# Patient Record
Sex: Female | Born: 1990 | Race: White | Hispanic: No | Marital: Single | Smoking: Never smoker
Health system: Southern US, Community
[De-identification: ages and names within clinical notes are randomized; demographics above are authoritative.]

## PROBLEM LIST (undated history)

## (undated) DIAGNOSIS — F319 Bipolar disorder, unspecified: Secondary | ICD-10-CM

## (undated) HISTORY — PX: KNEE ARTHROSCOPY W/ MENISCAL REPAIR: SHX1877

## (undated) HISTORY — DX: Bipolar disorder, unspecified: F31.9

---

## 2017-07-05 DIAGNOSIS — Z8619 Personal history of other infectious and parasitic diseases: Secondary | ICD-10-CM | POA: Insufficient documentation

## 2019-01-31 DIAGNOSIS — F3170 Bipolar disorder, currently in remission, most recent episode unspecified: Secondary | ICD-10-CM | POA: Insufficient documentation

## 2020-11-22 DIAGNOSIS — L700 Acne vulgaris: Secondary | ICD-10-CM | POA: Diagnosis not present

## 2020-11-22 DIAGNOSIS — Z3009 Encounter for other general counseling and advice on contraception: Secondary | ICD-10-CM | POA: Diagnosis not present

## 2020-12-22 DIAGNOSIS — Z20822 Contact with and (suspected) exposure to covid-19: Secondary | ICD-10-CM | POA: Diagnosis not present

## 2021-02-22 ENCOUNTER — Encounter: Payer: Self-pay | Admitting: Family Medicine

## 2021-02-22 ENCOUNTER — Other Ambulatory Visit: Payer: Self-pay

## 2021-02-22 ENCOUNTER — Ambulatory Visit: Payer: Managed Care, Other (non HMO) | Admitting: Family Medicine

## 2021-02-22 VITALS — BP 130/81 | HR 89 | Temp 98.1°F | Ht 65.16 in | Wt 210.3 lb

## 2021-02-22 DIAGNOSIS — F3170 Bipolar disorder, currently in remission, most recent episode unspecified: Secondary | ICD-10-CM

## 2021-02-22 MED ORDER — LAMOTRIGINE 150 MG PO TABS: 150.0000 mg | ORAL_TABLET | Freq: Every day | ORAL | 1 refills | Status: DC

## 2021-02-22 NOTE — Patient Instructions (Signed)
Great to meet you today! I have entered a referral to psychiatry Continue lamictal at current dose.

## 2021-02-22 NOTE — Progress Notes (Signed)
Lisa [name] - 30 y.o. female MRN [id number]  Date of birth: 04-Nov-1991  Subjective Chief Complaint  Patient presents with  . Establish Care    HPI Lisa [name] is a 30 y.o. female here today for initial visit to establish care.  She recently moved to the area from [place].  She has a history of bipolar disorder.  Previous records reviewed as available through care everywhere.  Her bipolar disorder is currently treated with Lamictal.  She is doing very well with this and denies any significant side effects.  She does need a refill of this medication.  She is also requesting referral to establish with a psychiatrist here.  Medications previously managed by psychiatry in [place].  ROS:  A comprehensive ROS was completed and negative except as noted per HPI  No Known Allergies  History reviewed. No pertinent past medical history.  Past Surgical History:  Procedure Laterality Date  . KNEE ARTHROSCOPY W/ MENISCAL REPAIR      Social History   Socioeconomic History  . Marital status: Married    Spouse name: Not on file  . Number of children: Not on file  . Years of education: Not on file  . Highest education level: Not on file  Occupational History  . Occupation: Risk analyst  Tobacco Use  . Smoking status: Never Smoker  . Smokeless tobacco: Never Used  Vaping Use  . Vaping Use: Never used  Substance and Sexual Activity  . Alcohol use: Yes    Alcohol/week: 0.0 - 1.0 standard drinks  . Drug use: Never  . Sexual activity: Yes    Birth control/protection: Pill  Other Topics Concern  . Not on file  Social History Narrative  . Not on file   Social Determinants of Health   Financial Resource Strain: Not on file  Food Insecurity: Not on file  Transportation Needs: Not on file  Physical Activity: Not on file  Stress: Not on file  Social Connections: Not on file    Family History  Problem Relation Age of Onset  . Hypertension Mother     Health Maintenance  Topic  Date Due  . Hepatitis C Screening  Never done  . HIV Screening  Never done  . PAP SMEAR-Modifier  Never done  . INFLUENZA VACCINE  07/28/2021 (Originally 06/27/2020)  . COVID-19 Vaccine (1) 07/28/2021 (Originally 12/04/1995)  . TETANUS/TDAP  01/07/2022  . HPV VACCINES  Aged Out     ----------------------------------------------------------------------------------------------------------------------------------------------------------------------------------------------------------------- Physical Exam BP 130/81 (BP Location: Left Arm, Patient Position: Sitting, Cuff Size: Large)   Pulse 89   Temp 98.1 F (36.7 C)   Ht 5' 5.16" (1.655 m)   Wt 210 lb 4.8 oz (95.4 kg)   SpO2 98%   BMI 34.83 kg/m   Physical Exam Constitutional:      Appearance: Normal appearance.  HENT:     Head: Normocephalic and atraumatic.  Eyes:     General: No scleral icterus. Cardiovascular:     Rate and Rhythm: Normal rate and regular rhythm.  Pulmonary:     Effort: Pulmonary effort is normal.     Breath sounds: Normal breath sounds.  Musculoskeletal:     Cervical back: Neck supple.  Neurological:     General: No focal deficit present.     Mental Status: She is alert.  Psychiatric:        Mood and Affect: Mood normal.        Behavior: Behavior normal.     ------------------------------------------------------------------------------------------------------------------------------------------------------------------------------------------------------------------- Assessment and Plan  Bipolar affective disorder in remission [facility]) Her bipolar disorder remains well controlled with Lamictal.  I recommend that she continue at this time and refills were sent to her local pharmacy until she is able to establish with local psychiatrist.  I did place a referral for psychiatry today and informed her that someone would contact her to schedule appointment.   Meds ordered this encounter  Medications  .  lamoTRIgine (LAMICTAL) 150 MG tablet    Sig: Take 1 tablet (150 mg total) by mouth daily.    Dispense:  90 tablet    Refill:  1    No follow-ups on file.    This visit occurred during the SARS-CoV-2 public health emergency.  Safety protocols were in place, including screening questions prior to the visit, additional usage of staff PPE, and extensive cleaning of exam room while observing appropriate contact time as indicated for disinfecting solutions.

## 2021-02-22 NOTE — Assessment & Plan Note (Signed)
Her bipolar disorder remains well controlled with Lamictal.  I recommend that she continue at this time and refills were sent to her local pharmacy until she is able to establish with local psychiatrist.  I did place a referral for psychiatry today and informed her that someone would contact her to schedule appointment.

## 2021-04-28 ENCOUNTER — Ambulatory Visit (INDEPENDENT_AMBULATORY_CARE_PROVIDER_SITE_OTHER): Payer: 59 | Admitting: Psychiatry

## 2021-04-28 ENCOUNTER — Encounter (HOSPITAL_COMMUNITY): Payer: Self-pay | Admitting: Psychiatry

## 2021-04-28 VITALS — BP 124/78 | Ht 65.0 in | Wt 189.0 lb

## 2021-04-28 DIAGNOSIS — F5102 Adjustment insomnia: Secondary | ICD-10-CM

## 2021-04-28 DIAGNOSIS — F3170 Bipolar disorder, currently in remission, most recent episode unspecified: Secondary | ICD-10-CM

## 2021-04-28 MED ORDER — TRAZODONE HCL 50 MG PO TABS: 50.0000 mg | ORAL_TABLET | Freq: Every day | ORAL | 0 refills | Status: DC

## 2021-04-28 NOTE — Progress Notes (Signed)
Psychiatric Initial Adult Assessment   Patient Identification: Lisa [name] MRN:  [id number] Date of Evaluation:  04/28/2021 Referral Source: Dr. Selena Batten Chief Complaint:  establish care,  Visit Diagnosis:    ICD-10-CM   1. Bipolar affective disorder in remission (HCC)  F31.70   2. Adjustment insomnia  F51.02     History of Present Illness: Patient is a 30 years old currently single [place] descent female lives with her parents relocated from [place].  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 Selena Batten to establish care for possible bipolar  Patient has been getting her medication from psychiatrist in [place] now relocated.  She describes history of having ups and down down.  Getting depression last 4 days a week including hopelessness but not suicidal thoughts including despair decreased interest withdrawn and sadness.  Her high period  Would be lasting for a week or more including high energy talkativeness excessive buying but no associated clear irrelevant behavior.  She did endorse some paranoia during that  She endorses worries would get excessive worries like panic attacks when she is on the downside or even on the higher side but when she is on medication she does feel that anxiety is manageable  A trigger in the past has been break-up in the relationship after 5 years this was last year and she went to into her depression phase and then a manic state or possible manic state  Does not advise as of now paranoia or hallucinations does not endorse panic attack  She does feel fear of abandonment because of past relationship concerns and also when she was younger she was very closely attached to her parents and had difficult time going to school  She also takes trazodone but she has had some weight concerns and low self-esteem so she stopped trazodone she is working on weight control and going to the gym in general she believes she would do better on trazodone as  it helps her sleep and indirectly anxiety   Time but endorses it was more so when she was on the downside  Aggravating factor: relationship breakups in the past, self esteem Modifying factor: parents, co workers, sister, gym work out  Duration since young age  Past suicide attempt denies Past drug use or alcohol denies    Past Psychiatric History: depression, anxiet6y  Previous Psychotropic Medications: Yes   Substance Abuse History in the last 12 months:  No.  Consequences of Substance Abuse: NA  Past Medical History: History reviewed. No pertinent past medical history.  Past Surgical History:  Procedure Laterality Date  . KNEE ARTHROSCOPY W/ MENISCAL REPAIR      Family Psychiatric History: mom: depression  Family History:  Family History  Problem Relation Age of Onset  . Hypertension Mother     Social History:   Social History   Socioeconomic History  . Marital status: Married    Spouse name: Not on file  . Number of children: Not on file  . Years of education: Not on file  . Highest education level: Not on file  Occupational History  . Occupation: Risk analyst  Tobacco Use  . Smoking status: Never Smoker  . Smokeless tobacco: Never Used  Vaping Use  . Vaping Use: Never used  Substance and Sexual Activity  . Alcohol use: Yes    Alcohol/week: 0.0 - 1.0 standard drinks  . Drug use: Never  . Sexual activity: Yes    Birth control/protection: Pill  Other Topics Concern  .  Not on file  Social History Narrative  . Not on file   Social Determinants of Health   Financial Resource Strain: Not on file  Food Insecurity: Not on file  Transportation Needs: Not on file  Physical Activity: Not on file  Stress: Not on file  Social Connections: Not on file    Additional Social History: grew up with parents, migrated from [place] age 73. Difficult in school adjustment and fear of abondomement   Allergies:  No Known Allergies  Metabolic Disorder  Labs: No results found for: HGBA1C, MPG No results found for: PROLACTIN No results found for: CHOL, TRIG, HDL, CHOLHDL, VLDL, LDLCALC No results found for: TSH  Therapeutic Level Labs: No results found for: LITHIUM No results found for: CBMZ No results found for: VALPROATE  Current Medications: Current Outpatient Medications  Medication Sig Dispense Refill  . drospirenone-ethinyl estradiol (YAZ) 3-0.02 MG tablet Take 1 tablet by mouth daily.    Marland Kitchen lamoTRIgine (LAMICTAL) 150 MG tablet Take 1 tablet (150 mg total) by mouth daily. 90 tablet 1  . traZODone (DESYREL) 50 MG tablet Take 1 tablet (50 mg total) by mouth at bedtime. 30 tablet 0   No current facility-administered medications for this visit.     Psychiatric Specialty Exam: Review of Systems  Cardiovascular: Negative for chest pain.  Psychiatric/Behavioral: Negative for agitation, dysphoric mood and self-injury.    Blood pressure 124/78, height 5\' 5"  (1.651 m), weight 189 lb (85.7 kg).Body mass index is 31.45 kg/m.  General Appearance: Casual  Eye Contact:  Fair  Speech:  Clear and Coherent  Volume:  Normal  Mood:  Euthymic  Affect:  Congruent  Thought Process:  Goal Directed  Orientation:  Full (Time, Place, and Person)  Thought Content:  Logical  Suicidal Thoughts:  No  Homicidal Thoughts:  No  Memory:  Immediate;   Fair Recent;   Good  Judgement:  Good  Insight:  Fair  Psychomotor Activity:  Normal  Concentration:  Concentration: Fair and Attention Span: Fair  Recall:  of Knowledge:Good  Language: Good  Akathisia:  No  Handed:    AIMS (if indicated):  not done  Assets:  Communication Skills Desire for Improvement Financial Resources/Insurance Housing  ADL's:  Intact  Cognition: WNL  Sleep:  Fair   Screenings: GAD-7   Flowsheet Row Office Visit from 02/22/2021 in [facility]  Total GAD-7 Score 4    PHQ2-9   Flowsheet Row Office Visit from 04/28/2021 in  [facility] [place] Office Visit from 02/22/2021 in [facility]  PHQ-2 Total Score 6 2  PHQ-9 Total Score 12 3    Flowsheet Row Office Visit from 04/28/2021 in [facility] [place]  C-SSRS RISK CATEGORY No Risk      Assessment and Plan: as follows  Bipolar per history: last episode depressed; doing fair on lamictal, will continue . No rash  insomnaL  Reviewed sleep hygiene, can restart trazadone small dose   Work on triggers to depression including abondonment fear or poor sleep, refer to therapy to work on 06/28/2021, self esteem and relationship concerns in the past  Fu 4-6 weeks or earlier if needed Total face to face time spent including chart review and documentation 45 min.   Pharmacologist, MD 6/2/202211:34 AM

## 2021-05-27 ENCOUNTER — Other Ambulatory Visit (HOSPITAL_COMMUNITY): Payer: Self-pay | Admitting: Psychiatry

## 2021-06-02 ENCOUNTER — Ambulatory Visit (INDEPENDENT_AMBULATORY_CARE_PROVIDER_SITE_OTHER): Payer: 59 | Admitting: Psychiatry

## 2021-06-02 ENCOUNTER — Encounter (HOSPITAL_COMMUNITY): Payer: Self-pay | Admitting: Psychiatry

## 2021-06-02 VITALS — BP 138/78 | Ht 65.0 in | Wt 183.0 lb

## 2021-06-02 DIAGNOSIS — F5102 Adjustment insomnia: Secondary | ICD-10-CM | POA: Diagnosis not present

## 2021-06-02 DIAGNOSIS — F3170 Bipolar disorder, currently in remission, most recent episode unspecified: Secondary | ICD-10-CM | POA: Diagnosis not present

## 2021-06-02 MED ORDER — TRAZODONE HCL 50 MG PO TABS: ORAL_TABLET | ORAL | 0 refills | Status: DC

## 2021-06-02 NOTE — Progress Notes (Signed)
[facility] Follow up visit  Patient Identification: Lisa [name] MRN:  [id number] Date of Evaluation:  06/02/2021 Referral Source: Dr. Selena Batten Chief Complaint:  follow up bipolar, sleep Visit Diagnosis:    ICD-10-CM   1. Bipolar affective disorder in remission (HCC)  F31.70     2. Adjustment insomnia  F51.02       History of Present Illness: Patient is a 30 years old currently single [place] descent female lives with her parents relocated from [place].  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 Selena Batten to establish care for possible bipolar   Doing fair on lamictal, has had concerns with relationship breakups in the past  Handling it fair . Supportive parents and sister Goes for walk and self growth is helping  Aggravating factor: relationship breakups in the past, self esteem Modifying factor: parents, co workers, sister, gym work out  Duration since young age  Past suicide attempt denies Past drug use or alcohol denies    Past Psychiatric History: depression, anxiet6y  Previous Psychotropic Medications: Yes   Substance Abuse History in the last 12 months:  No.  Consequences of Substance Abuse: NA  Past Medical History: No past medical history on file.  Past Surgical History:  Procedure Laterality Date   KNEE ARTHROSCOPY W/ MENISCAL REPAIR      Family Psychiatric History: mom: depression  Family History:  Family History  Problem Relation Age of Onset   Hypertension Mother     Social History:   Social History   Socioeconomic History   Marital status: Married    Spouse name: Not on file   Number of children: Not on file   Years of education: Not on file   Highest education level: Not on file  Occupational History   Occupation: Risk analyst  Tobacco Use   Smoking status: Never   Smokeless tobacco: Never  Vaping Use   Vaping Use: Never used  Substance and Sexual Activity   Alcohol use: Yes    Alcohol/week: 0.0 - 1.0  standard drinks   Drug use: Never   Sexual activity: Yes    Birth control/protection: Pill  Other Topics Concern   Not on file  Social History Narrative   Not on file   Social Determinants of Health   Financial Resource Strain: Not on file  Food Insecurity: Not on file  Transportation Needs: Not on file  Physical Activity: Not on file  Stress: Not on file  Social Connections: Not on file   w up with parents, migrated from [place] age 40. Difficult in school adjustment and fear of abondomement   Allergies:  No Known Allergies  Metabolic Disorder Labs: No results found for: HGBA1C, MPG No results found for: PROLACTIN No results found for: CHOL, TRIG, HDL, CHOLHDL, VLDL, LDLCALC No results found for: TSH  Therapeutic Level Labs: No results found for: LITHIUM No results found for: CBMZ No results found for: VALPROATE  Current Medications: Current Outpatient Medications  Medication Sig Dispense Refill   drospirenone-ethinyl estradiol (YAZ) 3-0.02 MG tablet Take 1 tablet by mouth daily.     lamoTRIgine (LAMICTAL) 150 MG tablet Take 1 tablet (150 mg total) by mouth daily. 90 tablet 1   traZODone (DESYREL) 50 MG tablet TAKE 1 TABLET BY MOUTH EVERYDAY AT BEDTIME 30 tablet 0   No current facility-administered medications for this visit.     Psychiatric Specialty Exam: Review of Systems  Cardiovascular:  Negative for chest pain.  Psychiatric/Behavioral:  Negative for  agitation, dysphoric mood and self-injury.    Blood pressure 138/78, height 5\' 5"  (1.651 m), weight 183 lb (83 kg).Body mass index is 30.45 kg/m.  General Appearance: Casual  Eye Contact:  Fair  Speech:  Clear and Coherent  Volume:  Normal  Mood:  Euthymic  Affect:  Congruent  Thought Process:  Goal Directed  Orientation:  Full (Time, Place, and Person)  Thought Content:  Logical  Suicidal Thoughts:  No  Homicidal Thoughts:  No  Memory:  Immediate;   Fair Recent;   Good  Judgement:  Good  Insight:   Fair  Psychomotor Activity:  Normal  Concentration:  Concentration: Fair and Attention Span: Fair  Recall:  of Knowledge:Good  Language: Good  Akathisia:  No  Handed:    AIMS (if indicated):  not done  Assets:  Communication Skills Desire for Improvement Financial Resources/Insurance Housing  ADL's:  Intact  Cognition: WNL  Sleep:  Fair   Screenings: GAD-7    Flowsheet Row Office Visit from 02/22/2021 in [facility]  Total GAD-7 Score 4      PHQ2-9    Flowsheet Row Office Visit from 04/28/2021 in [facility] [place] Office Visit from 02/22/2021 in [facility]  PHQ-2 Total Score 6 2  PHQ-9 Total Score 12 3      Flowsheet Row Office Visit from 06/02/2021 in [facility] [place] Office Visit from 04/28/2021 in [facility] [place]  C-SSRS RISK CATEGORY No Risk No Risk       Assessment and Plan: as follows  Prior documentation reviewed  Bipolar per history: last episode depressed; fair on lamictal, improved. No rash  Insomna: doing fair on trazadone Scheduled therapy with 06/28/2021 to work on relationship breakups and coping  Fu 66m.  Total face to face time spent including chart review and documentation 1m   , MD 7/7/20229:12 AM

## 2021-06-08 ENCOUNTER — Ambulatory Visit (INDEPENDENT_AMBULATORY_CARE_PROVIDER_SITE_OTHER): Payer: 59 | Admitting: Licensed Clinical Social Worker

## 2021-06-08 DIAGNOSIS — F3170 Bipolar disorder, currently in remission, most recent episode unspecified: Secondary | ICD-10-CM

## 2021-06-08 DIAGNOSIS — F5102 Adjustment insomnia: Secondary | ICD-10-CM
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
## 2021-06-21 ENCOUNTER — Other Ambulatory Visit (HOSPITAL_COMMUNITY): Payer: Self-pay | Admitting: Psychiatry

## 2021-07-14 ENCOUNTER — Ambulatory Visit (HOSPITAL_COMMUNITY): Payer: 59 | Admitting: Licensed Clinical Social Worker

## 2021-07-14 NOTE — Progress Notes (Signed)
Therapist contacted by text for session and she did not respond. Session is a no show

## 2021-07-23 ENCOUNTER — Other Ambulatory Visit (HOSPITAL_COMMUNITY): Payer: Self-pay | Admitting: Psychiatry

## 2021-08-04 ENCOUNTER — Ambulatory Visit (INDEPENDENT_AMBULATORY_CARE_PROVIDER_SITE_OTHER): Payer: 59 | Admitting: Licensed Clinical Social Worker

## 2021-08-04 DIAGNOSIS — F5102 Adjustment insomnia: Secondary | ICD-10-CM

## 2021-08-04 DIAGNOSIS — F3170 Bipolar disorder, currently in remission, most recent episode unspecified: Secondary | ICD-10-CM
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
## 2021-08-18 ENCOUNTER — Ambulatory Visit (HOSPITAL_COMMUNITY): Payer: 59 | Admitting: Licensed Clinical Social Worker

## 2021-08-19 ENCOUNTER — Other Ambulatory Visit: Payer: Self-pay | Admitting: Family Medicine

## 2021-08-22 ENCOUNTER — Other Ambulatory Visit (HOSPITAL_COMMUNITY): Payer: Self-pay | Admitting: Psychiatry

## 2021-09-06 ENCOUNTER — Ambulatory Visit (INDEPENDENT_AMBULATORY_CARE_PROVIDER_SITE_OTHER): Payer: 59 | Admitting: Psychiatry

## 2021-09-06 ENCOUNTER — Encounter (HOSPITAL_COMMUNITY): Payer: Self-pay | Admitting: Psychiatry

## 2021-09-06 VITALS — BP 120/76 | Temp 97.6°F | Ht 65.0 in | Wt 166.0 lb

## 2021-09-06 DIAGNOSIS — F5102 Adjustment insomnia: Secondary | ICD-10-CM | POA: Diagnosis not present

## 2021-09-06 DIAGNOSIS — F3170 Bipolar disorder, currently in remission, most recent episode unspecified: Secondary | ICD-10-CM | POA: Diagnosis not present

## 2021-09-06 MED ORDER — CITALOPRAM HYDROBROMIDE 10 MG PO TABS: 10.0000 mg | ORAL_TABLET | Freq: Every day | ORAL | 0 refills | Status: DC

## 2021-09-06 MED ORDER — LAMOTRIGINE 150 MG PO TABS: 150.0000 mg | ORAL_TABLET | Freq: Every day | ORAL | 0 refills | Status: DC

## 2021-09-06 NOTE — Progress Notes (Signed)
[facility] Follow up visit  Patient Identification: Lisa [name] MRN:  [id number] Date of Evaluation:  09/06/2021 Referral Source: Dr. Selena Batten Chief Complaint:  follow up bipolar, sleep Visit Diagnosis:    ICD-10-CM   1. Bipolar affective disorder in remission (HCC)  F31.70     2. Adjustment insomnia  F51.02       History of Present Illness: Patient is a 30 years old currently single [place] descent female lives with her parents relocated from [place].  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 Selena Batten to establish care for possible bipolar  Was doing fair, but now feeling down at times hopeless, no particular trigger and feels amotivated Has had breakups in past Sister is supportive and family Goes for walk and gym to help herself Has not seen counsellor recently, will reschedule  Aggravating factor: relationship breakups in the past, self esteem Modifying factor: parents, co workers, sister, gym work out  Duration since young age  Past suicide attempt denies Past drug use or alcohol denies    Past Psychiatric History: depression, [id number]  Previous Psychotropic Medications: Yes   Substance Abuse History in the last 12 months:  No.  Consequences of Substance Abuse: NA  Past Medical History: History reviewed. No pertinent past medical history.  Past Surgical History:  Procedure Laterality Date   KNEE ARTHROSCOPY W/ MENISCAL REPAIR      Family Psychiatric History: mom: depression  Family History:  Family History  Problem Relation Age of Onset   Hypertension Mother     Social History:   Social History   Socioeconomic History   Marital status: Single    Spouse name: Not on file   Number of children: Not on file   Years of education: Not on file   Highest education level: Not on file  Occupational History   Occupation: Risk analyst  Tobacco Use   Smoking status: Never   Smokeless tobacco: Never  Vaping Use   Vaping  Use: Never used  Substance and Sexual Activity   Alcohol use: Yes    Alcohol/week: 0.0 - 1.0 standard drinks   Drug use: Never   Sexual activity: Yes    Birth control/protection: Pill  Other Topics Concern   Not on file  Social History Narrative   Not on file   Social Determinants of Health   Financial Resource Strain: Not on file  Food Insecurity: Not on file  Transportation Needs: Not on file  Physical Activity: Not on file  Stress: Not on file  Social Connections: Not on file   w up with parents, migrated from [place] age 67. Difficult in school adjustment and fear of abondomement   Allergies:  No Known Allergies  Metabolic Disorder Labs: No results found for: HGBA1C, MPG No results found for: PROLACTIN No results found for: CHOL, TRIG, HDL, CHOLHDL, VLDL, LDLCALC No results found for: TSH  Therapeutic Level Labs: No results found for: LITHIUM No results found for: CBMZ No results found for: VALPROATE  Current Medications: Current Outpatient Medications  Medication Sig Dispense Refill   citalopram (CELEXA) 10 MG tablet Take 1 tablet (10 mg total) by mouth daily. 30 tablet 0   drospirenone-ethinyl estradiol (YAZ) 3-0.02 MG tablet Take 1 tablet by mouth daily.     lamoTRIgine (LAMICTAL) 150 MG tablet Take 1 tablet (150 mg total) by mouth daily. 30 tablet 0   traZODone (DESYREL) 50 MG tablet TAKE 1 TABLET BY MOUTH EVERYDAY AT BEDTIME 30 tablet 0  No current facility-administered medications for this visit.     Psychiatric Specialty Exam: Review of Systems  Cardiovascular:  Negative for chest pain.  Psychiatric/Behavioral:  Positive for dysphoric mood. Negative for agitation and self-injury.    Blood pressure 120/76, temperature 97.6 F (36.4 C), height 5\' 5"  (1.651 m), weight 166 lb (75.3 kg).Body mass index is 27.62 kg/m.  General Appearance: Casual  Eye Contact:  Fair  Speech:  Clear and Coherent  Volume:  Normal  Mood:  dysphoric  Affect:  Congruent   Thought Process:  Goal Directed  Orientation:  Full (Time, Place, and Person)  Thought Content:  Logical  Suicidal Thoughts:  No  Homicidal Thoughts:  No  Memory:  Immediate;   Fair Recent;   Good  Judgement:  Good  Insight:  Fair  Psychomotor Activity:  Normal  Concentration:  Concentration: Fair and Attention Span: Fair  Recall:  of Knowledge:Good  Language: Good  Akathisia:  No  Handed:    AIMS (if indicated):  not done  Assets:  Communication Skills Desire for Improvement Financial Resources/Insurance Housing  ADL's:  Intact  Cognition: WNL  Sleep:  Fair   Screenings: GAD-7    Flowsheet Row Office Visit from 02/22/2021 in [facility]  Total GAD-7 Score 4      PHQ2-9    Flowsheet Row Counselor from 06/08/2021 in [facility] [place] Office Visit from 04/28/2021 in [facility] [place] Office Visit from 02/22/2021 in [facility]  PHQ-2 Total Score 1 6 2   PHQ-9 Total Score -- 12 3      Flowsheet Row Office Visit from 09/06/2021 in [facility] [place] Counselor from 06/08/2021 in [facility] [place] Office Visit from 06/02/2021 in [facility] [place] [facility]  C-SSRS RISK CATEGORY No Risk No Risk No Risk       Assessment and Plan: as follows  Prior documentation reviewed  Bipolar per history: last episode depressed;feeling dysphoric, continue lamictal. Add celexa 10mg  increase to 20mg  in one week or call earlier for conerns   Insomna: manageable on trazadone Schedule therapy again with 06/10/2021, has not seen her to work on depression and coping skills Time spent in office face to face 08/03/2021  , MD 10/11/20228:48 AM

## 2021-09-18 ENCOUNTER — Other Ambulatory Visit (HOSPITAL_COMMUNITY): Payer: Self-pay | Admitting: Psychiatry

## 2021-09-19 ENCOUNTER — Other Ambulatory Visit (HOSPITAL_COMMUNITY): Payer: Self-pay | Admitting: Psychiatry

## 2021-09-29 ENCOUNTER — Ambulatory Visit (INDEPENDENT_AMBULATORY_CARE_PROVIDER_SITE_OTHER): Payer: 59 | Admitting: Psychiatry

## 2021-09-29 ENCOUNTER — Encounter (HOSPITAL_COMMUNITY): Payer: Self-pay | Admitting: Psychiatry

## 2021-09-29 VITALS — BP 120/88 | HR 72 | Ht 66.0 in | Wt 163.0 lb

## 2021-09-29 DIAGNOSIS — F5102 Adjustment insomnia: Secondary | ICD-10-CM

## 2021-09-29 DIAGNOSIS — F3170 Bipolar disorder, currently in remission, most recent episode unspecified: Secondary | ICD-10-CM | POA: Diagnosis not present

## 2021-09-29 MED ORDER — CITALOPRAM HYDROBROMIDE 10 MG PO TABS: 10.0000 mg | ORAL_TABLET | Freq: Every day | ORAL | 1 refills | Status: DC

## 2021-09-29 NOTE — Progress Notes (Signed)
[facility] Follow up visit  Patient Identification: Lisa [name] MRN:  [id number] Date of Evaluation:  09/29/2021 Referral Source: Dr. Selena [name] Chief Complaint:  follow up bipolar, sleep Visit Diagnosis:    ICD-10-CM   1. Bipolar affective disorder in remission (HCC)  F31.70     2. Adjustment insomnia  F51.02       History of Present Illness: Patient is a 30 years old currently single [place] descent female lives with her parents relocated from [place].  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 Selena [name] to establish care for possible bipolar   Was feeling down depressed last visit with no particular triggers sister is supportive.  We added citalopram 10 mg that has helped she is feeling happy less depressed and feeling the medication has helped.  She is more motivated   Aggravating factor: relationship breakups in the past, self esteem Modifying factor: Parents, co workers, sister, gym work out  Duration since young age  Past suicide attempt denies Past drug use or alcohol denies    Past Psychiatric History: depression, anxiet6y  Previous Psychotropic Medications: Yes   Substance Abuse History in the last 12 months:  No.  Consequences of Substance Abuse: NA  Past Medical History: History reviewed. No pertinent past medical history.  Past Surgical History:  Procedure Laterality Date   KNEE ARTHROSCOPY W/ MENISCAL REPAIR      Family Psychiatric History: mom: depression  Family History:  Family History  Problem Relation Age of Onset   Hypertension Mother     Social History:   Social History   Socioeconomic History   Marital status: Single    Spouse name: Not on file   Number of children: Not on file   Years of education: Not on file   Highest education level: Not on file  Occupational History   Occupation: Risk analyst  Tobacco Use   Smoking status: Never   Smokeless tobacco: Never  Vaping Use   Vaping Use: Never used   Substance and Sexual Activity   Alcohol use: Yes    Alcohol/week: 0.0 - 1.0 standard drinks   Drug use: Never   Sexual activity: Yes    Birth control/protection: Pill  Other Topics Concern   Not on file  Social History Narrative   Not on file   Social Determinants of Health   Financial Resource Strain: Not on file  Food Insecurity: Not on file  Transportation Needs: Not on file  Physical Activity: Not on file  Stress: Not on file  Social Connections: Not on file   w up with parents, migrated from [place] age 29. Difficult in school adjustment and fear of abondomement   Allergies:  No Known Allergies  Metabolic Disorder Labs: No results found for: HGBA1C, MPG No results found for: PROLACTIN No results found for: CHOL, TRIG, HDL, CHOLHDL, VLDL, LDLCALC No results found for: TSH  Therapeutic Level Labs: No results found for: LITHIUM No results found for: CBMZ No results found for: VALPROATE  Current Medications: Current Outpatient Medications  Medication Sig Dispense Refill   drospirenone-ethinyl estradiol (YAZ) 3-0.02 MG tablet Take 1 tablet by mouth daily.     lamoTRIgine (LAMICTAL) 150 MG tablet Take 1 tablet (150 mg total) by mouth daily. 30 tablet 0   traZODone (DESYREL) 50 MG tablet TAKE 1 TABLET BY MOUTH EVERYDAY AT BEDTIME 90 tablet 0   citalopram (CELEXA) 10 MG tablet Take 1 tablet (10 mg total) by mouth daily. 30 tablet 1  No current facility-administered medications for this visit.     Psychiatric Specialty Exam: Review of Systems  Cardiovascular:  Negative for chest pain.  Psychiatric/Behavioral:  Negative for agitation and self-injury.    Blood pressure 120/88, [name] 72, height 5\' 6"  (1.676 m), weight 163 lb (73.9 kg), SpO2 99 %.Body mass index is 26.31 kg/m.  General Appearance: Casual  Eye Contact:  Fair  Speech:  Clear and Coherent  Volume:  Normal  Mood: Improved   Affect:  Congruent  Thought Process:  Goal Directed  Orientation:  Full  (Time, Place, and Person)  Thought Content:  Logical  Suicidal Thoughts:  No  Homicidal Thoughts:  No  Memory:  Immediate;   Fair Recent;   Good  Judgement:  Good  Insight:  Fair  Psychomotor Activity:  Normal  Concentration:  Concentration: Fair and Attention Span: Fair  Recall:  of Knowledge:Good  Language: Good  Akathisia:  No  Handed:    AIMS (if indicated):  not done  Assets:  Communication Skills Desire for Improvement Financial Resources/Insurance Housing  ADL's:  Intact  Cognition: WNL  Sleep:  Fair   Screenings: GAD-7    Flowsheet Row Office Visit from 02/22/2021 in [facility]  Total GAD-7 Score 4      PHQ2-9    Flowsheet Row Office Visit from 09/29/2021 in [facility] [place] Counselor from 06/08/2021 in [facility] AT [place] Office Visit from 04/28/2021 in [facility] AT [place] Office Visit from 02/22/2021 in [facility]  PHQ-2 Total Score 0 1 6 2   PHQ-9 Total Score -- -- 12 3      Flowsheet Row Office Visit from 09/29/2021 in [facility] AT [place] Office Visit from 09/06/2021 in [facility] AT [place] Counselor from 06/08/2021 in [facility] AT [place]  C-SSRS RISK CATEGORY No Risk No Risk No Risk       Assessment and Plan: as follows  Prior documentation reviewed  Bipolar per history: last episode depressed; improved less dysphoric continue Lamictal citalopram has helped we will continue 10 mg  Insomna: Manageable on trazodone discussed sleep hygiene Time spent in office face to face 10 to 15 minutes Follow-up in 3 months renewed meds which were due 11/06/2021, MD 11/3/20228:51 AM

## 2021-09-30 ENCOUNTER — Ambulatory Visit (INDEPENDENT_AMBULATORY_CARE_PROVIDER_SITE_OTHER): Payer: 59 | Admitting: Licensed Clinical Social Worker

## 2021-09-30 DIAGNOSIS — F3170 Bipolar disorder, currently in remission, most recent episode unspecified: Secondary | ICD-10-CM | POA: Diagnosis not present

## 2021-09-30 DIAGNOSIS — F5102 Adjustment insomnia: Secondary | ICD-10-CM

## 2021-09-30 NOTE — Progress Notes (Signed)
  Virtual Visit via Video Note  I connected with Lisa [name] on 09/30/21 at  9:00 AM EDT by a video enabled telemedicine application and verified that I am speaking with the correct person using two identifiers.  Location: Patient: home Provider: home office   I discussed the limitations of evaluation and management by telemedicine and the availability of in person appointments. The patient expressed understanding and agreed to proceed.   I discussed the assessment and treatment plan with the patient. The patient was provided an opportunity to ask questions and all were answered. The patient agreed with the plan and demonstrated an understanding of the instructions.   The patient was advised to call back or seek an in-person evaluation if the symptoms worsen or if the condition fails to improve as anticipated.  I provided 16 minutes of non-face-to-face time during this encounter.  THERAPIST PROGRESS NOTE  Session Time: 9:00 AM to 9:16 AM  Participation Level: Active  Behavioral Response: CasualAlertEuthymic  Type of Therapy: Individual Therapy  Treatment Goals addressed:  Work on imaging triggers that bring back memories of past relationship, anxiety, self-esteem, coping Interventions: Solution Focused, Strength-based, Supportive, and Other: coping   Summary: Lisa [name] is a 30 y.o. female who presents with having episodes of thoughts of dying. Didn't now what happened but got really depressed. Met with doctor and medicine he prescribed working well.  Reviewed what she wants to do with session if she wants to look at worksheets and she prefers that therapist since her depression workbook and therapist will also send her worksheet on depression separately.  We will review at next session.  Reviewed what we had been working on being triggered to think about past relationship.  She found trick for triggers  that works well. She stopped listening to music associated with the relationship and  listening to all new music. She thinks music can help tremendously. Listening to EDM which she describes as electronic dance music.  Therapist provided active listening open questions supportive interventions  Suicidal/Homicidal: No  Plan: Return again in 5 weeks.2.  Review chapters of depression work book sent to patient, as well as worksheet review any other issues patient brings up at session  Diagnosis: Axis I: bipolar disorder in remission, adjustment insomnia    Axis II: No diagnosis    Cordella Register, LCSW 09/30/2021

## 2021-10-13 ENCOUNTER — Other Ambulatory Visit (HOSPITAL_COMMUNITY): Payer: Self-pay | Admitting: Psychiatry

## 2021-10-31 ENCOUNTER — Other Ambulatory Visit (HOSPITAL_COMMUNITY): Payer: Self-pay | Admitting: Psychiatry

## 2021-11-04 ENCOUNTER — Ambulatory Visit (HOSPITAL_COMMUNITY): Payer: 59 | Admitting: Licensed Clinical Social Worker

## 2021-12-29 ENCOUNTER — Encounter (HOSPITAL_COMMUNITY): Payer: Self-pay | Admitting: Psychiatry

## 2021-12-29 ENCOUNTER — Ambulatory Visit (INDEPENDENT_AMBULATORY_CARE_PROVIDER_SITE_OTHER): Payer: 59 | Admitting: Psychiatry

## 2021-12-29 VITALS — BP 108/78 | Temp 97.7°F | Ht 65.0 in | Wt 155.0 lb

## 2021-12-29 DIAGNOSIS — F3170 Bipolar disorder, currently in remission, most recent episode unspecified: Secondary | ICD-10-CM | POA: Diagnosis not present

## 2021-12-29 DIAGNOSIS — F5102 Adjustment insomnia: Secondary | ICD-10-CM

## 2021-12-29 MED ORDER — LAMOTRIGINE 150 MG PO TABS: 150.0000 mg | ORAL_TABLET | Freq: Every day | ORAL | 0 refills | Status: DC

## 2021-12-29 MED ORDER — CITALOPRAM HYDROBROMIDE 10 MG PO TABS: 10.0000 mg | ORAL_TABLET | Freq: Every day | ORAL | 0 refills | Status: DC

## 2021-12-29 NOTE — Progress Notes (Signed)
[facility] Follow up visit  Patient Identification: Lisa [name] MRN:  [id number] Date of Evaluation:  12/29/2021 Referral Source: Dr. Selena Batten Chief Complaint:  follow up bipolar, sleep Visit Diagnosis:    ICD-10-CM   1. Bipolar affective disorder in remission (HCC)  F31.70     2. Adjustment insomnia  F51.02       History of Present Illness: Patient is a 31 years old currently single [place] descent female lives with her parents relocated from [place].  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 Selena Batten to establish care for possible bipolar  Doing fair, going to gym has helped, less anxious  Severity improved Celexa additon has helped  Aggravating factor: relationship breakups in the past, self esteem Modifying factor: parents, co workers, sister, gym work out  Duration since young age  Past suicide attempt denies Past drug use or alcohol denies    Past Psychiatric History: depression, anxiet6y  Previous Psychotropic Medications: Yes   Substance Abuse History in the last 12 months:  No.  Consequences of Substance Abuse: NA  Past Medical History: No past medical history on file.  Past Surgical History:  Procedure Laterality Date   KNEE ARTHROSCOPY W/ MENISCAL REPAIR      Family Psychiatric History: mom: depression  Family History:  Family History  Problem Relation Age of Onset   Hypertension Mother     Social History:   Social History   Socioeconomic History   Marital status: Single    Spouse name: Not on file   Number of children: Not on file   Years of education: Not on file   Highest education level: Not on file  Occupational History   Occupation: Risk analyst  Tobacco Use   Smoking status: Never   Smokeless tobacco: Never  Vaping Use   Vaping Use: Never used  Substance and Sexual Activity   Alcohol use: Yes    Alcohol/week: 0.0 - 1.0 standard drinks   Drug use: Never   Sexual activity: Yes    Birth  control/protection: Pill  Other Topics Concern   Not on file  Social History Narrative   Not on file   Social Determinants of Health   Financial Resource Strain: Not on file  Food Insecurity: Not on file  Transportation Needs: Not on file  Physical Activity: Not on file  Stress: Not on file  Social Connections: Not on file   w up with parents, migrated from [place] age 24. Difficult in school adjustment and fear of abondomement   Allergies:  No Known Allergies  Metabolic Disorder Labs: No results found for: HGBA1C, MPG No results found for: PROLACTIN No results found for: CHOL, TRIG, HDL, CHOLHDL, VLDL, LDLCALC No results found for: TSH  Therapeutic Level Labs: No results found for: LITHIUM No results found for: CBMZ No results found for: VALPROATE  Current Medications: Current Outpatient Medications  Medication Sig Dispense Refill   citalopram (CELEXA) 10 MG tablet Take 1 tablet (10 mg total) by mouth daily. 90 tablet 0   drospirenone-ethinyl estradiol (YAZ) 3-0.02 MG tablet Take 1 tablet by mouth daily.     lamoTRIgine (LAMICTAL) 150 MG tablet Take 1 tablet (150 mg total) by mouth daily. 90 tablet 0   traZODone (DESYREL) 50 MG tablet TAKE 1 TABLET BY MOUTH EVERYDAY AT BEDTIME 90 tablet 0   No current facility-administered medications for this visit.     Psychiatric Specialty Exam: Review of Systems  Cardiovascular:  Negative for chest pain.  Neurological:  Negative for tremors.  Psychiatric/Behavioral:  Negative for agitation and self-injury.    Blood pressure 108/78, temperature 97.7 F (36.5 C), height 5\' 5"  (1.651 m), weight 155 lb (70.3 kg).Body mass index is 25.79 kg/m.  General Appearance: Casual  Eye Contact:  Fair  Speech:  Clear and Coherent  Volume:  Normal  Mood: Improved   Affect:  Congruent  Thought Process:  Goal Directed  Orientation:  Full (Time, Place, and Person)  Thought Content:  Logical  Suicidal Thoughts:  No  Homicidal Thoughts:  No   Memory:  Immediate;   Fair Recent;   Good  Judgement:  Good  Insight:  Fair  Psychomotor Activity:  Normal  Concentration:  Concentration: Fair and Attention Span: Fair  Recall:  of Knowledge:Good  Language: Good  Akathisia:  No  Handed:    AIMS (if indicated):  not done  Assets:  Communication Skills Desire for Improvement Financial Resources/Insurance Housing  ADL's:  Intact  Cognition: WNL  Sleep:  Fair   Screenings: GAD-7    Flowsheet Row Office Visit from 02/22/2021 in [facility]  Total GAD-7 Score 4      PHQ2-9    Flowsheet Row Office Visit from 09/29/2021 in [facility] [place] Counselor from 06/08/2021 in [facility] AT [place] Office Visit from 04/28/2021 in [facility] AT [place] Office Visit from 02/22/2021 in [facility]  PHQ-2 Total Score 0 1 6 2   PHQ-9 Total Score -- -- 12 3      Flowsheet Row Office Visit from 12/29/2021 in [facility] AT [place] Office Visit from 09/29/2021 in [facility] AT [place] Office Visit from 09/06/2021 in [facility] AT [place]  C-SSRS RISK CATEGORY No Risk No Risk No Risk       Assessment and Plan: as follows Prior documentation reviewed  No rash   Bipolar per history: last episode depressed; diong fair continue lamictal, celexa  Insomna: manageable with trazadone, will continue  Fu 51m.  Time spent in office face to face 11/06/2021 1m, MD 2/2/20238:41 AM

## 2022-01-01 ENCOUNTER — Other Ambulatory Visit (HOSPITAL_COMMUNITY): Payer: Self-pay | Admitting: Psychiatry

## 2022-02-03 ENCOUNTER — Ambulatory Visit: Payer: Medicaid Other | Admitting: Medical-Surgical

## 2022-03-15 ENCOUNTER — Ambulatory Visit (INDEPENDENT_AMBULATORY_CARE_PROVIDER_SITE_OTHER): Payer: Managed Care, Other (non HMO) | Admitting: Family Medicine

## 2022-03-15 ENCOUNTER — Encounter: Payer: Self-pay | Admitting: Family Medicine

## 2022-03-15 ENCOUNTER — Ambulatory Visit (INDEPENDENT_AMBULATORY_CARE_PROVIDER_SITE_OTHER): Payer: Managed Care, Other (non HMO)

## 2022-03-15 VITALS — BP 110/73 | HR 74 | Ht 65.0 in | Wt 161.0 lb

## 2022-03-15 DIAGNOSIS — M7989 Other specified soft tissue disorders: Secondary | ICD-10-CM | POA: Insufficient documentation

## 2022-03-15 NOTE — Progress Notes (Signed)
?  Lisa [name] - 31 y.o. female MRN [id number]  Date of birth: [birth year] ? ?Subjective ?No chief complaint on file. ? ? ?HPI ?Lisa [name] is a 31 y.o. female here today with complaint of leg swelling and tightness. She has had persistent leg swelling with tightness in the calf for the past couple of months.  The intensity of the swelling worsens depending on how much she is on her feet.  She denies severe pain or shortness of breath.  She does not recall any injury or overuse.  She is a non-smoker and does not use any hormonal contraception.   ? ?ROS:  A comprehensive ROS was completed and negative except as noted per HPI ? ?No Known Allergies ? ?No past medical history on file. ? ?Past Surgical History:  ?Procedure Laterality Date  ? KNEE ARTHROSCOPY W/ MENISCAL REPAIR    ? ? ?Social History  ? ?Socioeconomic History  ? Marital status: Single  ?  Spouse name: Not on file  ? Number of children: Not on file  ? Years of education: Not on file  ? Highest education level: Not on file  ?Occupational History  ? Occupation: Risk analyst  ?Tobacco Use  ? Smoking status: Never  ? Smokeless tobacco: Never  ?Vaping Use  ? Vaping Use: Never used  ?Substance and Sexual Activity  ? Alcohol use: Yes  ?  Alcohol/week: 0.0 - 1.0 standard drinks  ? Drug use: Never  ? Sexual activity: Yes  ?  Birth control/protection: Pill  ?Other Topics Concern  ? Not on file  ?Social History Narrative  ? Not on file  ? ?Social Determinants of Health  ? ?Financial Resource Strain: Not on file  ?Food Insecurity: Not on file  ?Transportation Needs: Not on file  ?Physical Activity: Not on file  ?Stress: Not on file  ?Social Connections: Not on file  ? ? ?Family History  ?Problem Relation Age of Onset  ? Hypertension Mother   ? ? ?Health Maintenance  ?Topic Date Due  ? COVID-19 Vaccine (1) Never done  ? HIV Screening  Never done  ? Hepatitis C Screening  Never done  ? PAP SMEAR-Modifier  Never done  ? TETANUS/TDAP  01/07/2022  ? INFLUENZA VACCINE   06/27/2022  ? HPV VACCINES  Aged Out  ? ? ? ?----------------------------------------------------------------------------------------------------------------------------------------------------------------------------------------------------------------- ?Physical Exam ?BP 110/73 (BP Location: Left Arm, Patient Position: Sitting, Cuff Size: Normal)   Pulse 74   Ht 5\' 5"  (1.651 m)   Wt 161 lb (73 kg)   SpO2 99%   BMI 26.79 kg/m?  ? ?Physical Exam ?Constitutional:   ?   Appearance: Normal appearance.  ?Musculoskeletal:  ?   Comments: Left leg larger when compared to R.  She has feeling of tightness with compression of calf.  Increased tightness but no pain with [place].   ?Neurological:  ?   Mental Status: She is alert.  ? ? ?------------------------------------------------------------------------------------------------------------------------------------------------------------------------------------------------------------------- ?Assessment and Plan ? ?Left leg swelling ?I have concern about possible DVT causing her leg swelling.  Orders entered for stat of the lower extremity.   ? ? ?No orders of the defined types were placed in this encounter. ? ? ?No follow-ups on file. ? ? ? ?This visit occurred during the SARS-CoV-2 public health emergency.  Safety protocols were in place, including screening questions prior to the visit, additional usage of staff PPE, and extensive cleaning of exam room while observing appropriate contact time as indicated for disinfecting solutions.  ? ?

## 2022-03-15 NOTE — Assessment & Plan Note (Signed)
I have concern about possible DVT causing her leg swelling.  Orders entered for stat US of the lower extremity.   ?

## 2022-03-27 ENCOUNTER — Other Ambulatory Visit (HOSPITAL_COMMUNITY): Payer: Self-pay | Admitting: Psychiatry

## 2022-04-11 ENCOUNTER — Ambulatory Visit (HOSPITAL_COMMUNITY): Payer: 59 | Admitting: Psychiatry

## 2022-04-18 ENCOUNTER — Other Ambulatory Visit (HOSPITAL_COMMUNITY): Payer: Self-pay | Admitting: Psychiatry

## 2022-04-22 ENCOUNTER — Other Ambulatory Visit (HOSPITAL_COMMUNITY): Payer: Self-pay | Admitting: Psychiatry

## 2022-05-02 ENCOUNTER — Telehealth (HOSPITAL_COMMUNITY): Payer: Self-pay

## 2022-05-02 ENCOUNTER — Other Ambulatory Visit (HOSPITAL_COMMUNITY): Payer: Self-pay | Admitting: Psychiatry

## 2022-05-02 NOTE — Telephone Encounter (Signed)
Dr. Gilmore Laroche sent a 30 day Rx on 04-18-22 with note that pt needs appt within 1 month, so she is not yet out but does not have appt on his book so I will not refill.

## 2022-05-02 NOTE — Telephone Encounter (Signed)
Medication management - Telephone call with pt after speaking with pt's [facility] to arrange for a one time 30 day order of pt's Lamictal to be filled from order e-scribed in on 04/18/22 by Dr. Gilmore Laroche and arranged for pt to be seen on 05/29/22 at 3:00 pm.  Patient agreed to appointment time that day and will request 90 day order of medications to be sent in after that evaluation, per her insurance coverage approvals and requests.

## 2022-05-02 NOTE — Telephone Encounter (Signed)
New message    1. Which medications need to be refilled? (please list name of each medication and dose if known) lamoTRIgine (LAMICTAL) 150 MG tablet  2. Which pharmacy/location (including street and city if local pharmacy) is medication to be sent to?[id number] - [place],  - [address]  3. Do they need a 30 day or 90 day supply? 30 days supply

## 2022-05-29 ENCOUNTER — Ambulatory Visit (HOSPITAL_COMMUNITY): Payer: 59 | Admitting: Psychiatry

## 2022-05-31 ENCOUNTER — Telehealth (HOSPITAL_COMMUNITY): Payer: Self-pay

## 2022-05-31 NOTE — Telephone Encounter (Signed)
Medication refill - Fax from pt's [facility] on [address] for a new Lamotrigine order, last prescribed 04/18/22 with note for pt to make an appointment.  Pt had one for 05/29/22 that was reschedueld for provier out that day.  Returns now on 06/19/22.  New refill order needed until that time.

## 2022-06-02 ENCOUNTER — Other Ambulatory Visit (HOSPITAL_COMMUNITY): Payer: Self-pay

## 2022-06-02 MED ORDER — LAMOTRIGINE 150 MG PO TABS: 150.0000 mg | ORAL_TABLET | Freq: Every day | ORAL | 0 refills | Status: DC

## 2022-06-19 ENCOUNTER — Telehealth (HOSPITAL_COMMUNITY): Payer: 59 | Admitting: Psychiatry

## 2022-06-19 ENCOUNTER — Encounter (HOSPITAL_COMMUNITY): Payer: Self-pay | Admitting: Psychiatry

## 2022-06-19 DIAGNOSIS — F3170 Bipolar disorder, currently in remission, most recent episode unspecified: Secondary | ICD-10-CM | POA: Diagnosis not present

## 2022-06-19 DIAGNOSIS — F5102 Adjustment insomnia: Secondary | ICD-10-CM

## 2022-06-19 MED ORDER — TRAZODONE HCL 50 MG PO TABS: ORAL_TABLET | ORAL | 0 refills | Status: DC

## 2022-06-19 MED ORDER — CITALOPRAM HYDROBROMIDE 10 MG PO TABS: 10.0000 mg | ORAL_TABLET | Freq: Every day | ORAL | 0 refills | Status: DC

## 2022-06-19 MED ORDER — LAMOTRIGINE 150 MG PO TABS: 150.0000 mg | ORAL_TABLET | Freq: Every day | ORAL | 0 refills | Status: DC

## 2022-06-19 NOTE — Progress Notes (Signed)
[facility] Follow up visit  Patient Identification: Shameca Landen MRN:  [id number] Date of Evaluation:  06/19/2022 Referral Source: Dr. Selena Batten Chief Complaint:  follow up bipolar, sleep Visit Diagnosis:    ICD-10-CM   1. Bipolar affective disorder in remission (HCC)  F31.70     2. Adjustment insomnia  F51.02     Virtual Visit via Video Note  I connected with [name] on 06/19/22 at  1:00 PM EDT by a video enabled telemedicine application and verified that I am speaking with the correct person using two identifiers.  Location: Patient: work Provider: home office   I discussed the limitations of evaluation and management by telemedicine and the availability of in person appointments. The patient expressed understanding and agreed to proceed.      I discussed the assessment and treatment plan with the patient. The patient was provided an opportunity to ask questions and all were answered. The patient agreed with the plan and demonstrated an understanding of the instructions.   The patient was advised to call back or seek an in-person evaluation if the symptoms worsen or if the condition fails to improve as anticipated.  I provided 15 minutes of non-face-to-face time during this encounter.    History of Present Illness: Patient is a 31 years old currently single [place] descent female lives with her parents relocated from [place].  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 Selena Batten to establish care for possible bipolar  Doing fair, job stess is manageable,  Goes to gym and fels mood ,depression is balanced   Aggravating factor: relationship breakups in the past, self esteem Modifying factor: parents, co workers, sister, gym work out  Duration since young age  Past suicide attempt denies Past drug use or alcohol denies    Past Psychiatric History: depression, anxiet6y  Previous Psychotropic Medications: Yes   Substance Abuse History in  the last 12 months:  No.  Consequences of Substance Abuse: NA  Past Medical History: History reviewed. No pertinent past medical history.  Past Surgical History:  Procedure Laterality Date   KNEE ARTHROSCOPY W/ MENISCAL REPAIR      Family Psychiatric History: mom: depression  Family History:  Family History  Problem Relation Age of Onset   Hypertension Mother     Social History:   Social History   Socioeconomic History   Marital status: Single    Spouse name: Not on file   Number of children: Not on file   Years of education: Not on file   Highest education level: Not on file  Occupational History   Occupation: Risk analyst  Tobacco Use   Smoking status: Never   Smokeless tobacco: Never  Vaping Use   Vaping Use: Never used  Substance and Sexual Activity   Alcohol use: Yes    Alcohol/week: 0.0 - 1.0 standard drinks of alcohol   Drug use: Never   Sexual activity: Yes    Birth control/protection: Pill  Other Topics Concern   Not on file  Social History Narrative   Not on file   Social Determinants of Health   Financial Resource Strain: Not on file  Food Insecurity: Not on file  Transportation Needs: Not on file  Physical Activity: Not on file  Stress: Not on file  Social Connections: Not on file   w up with parents, migrated from [place] age 55. Difficult in school adjustment and fear of abondomement   Allergies:  No Known Allergies  Metabolic Disorder Labs:  No results found for: "HGBA1C", "MPG" No results found for: "PROLACTIN" No results found for: "CHOL", "TRIG", "HDL", "CHOLHDL", "VLDL", "LDLCALC" No results found for: "TSH"  Therapeutic Level Labs: No results found for: "LITHIUM" No results found for: "CBMZ" No results found for: "VALPROATE"  Current Medications: Current Outpatient Medications  Medication Sig Dispense Refill   citalopram (CELEXA) 10 MG tablet Take 1 tablet (10 mg total) by mouth daily. 90 tablet 0   lamoTRIgine (LAMICTAL)  150 MG tablet Take 1 tablet (150 mg total) by mouth daily. 90 tablet 0   traZODone (DESYREL) 50 MG tablet Take at night 90 tablet 0   No current facility-administered medications for this visit.     Psychiatric Specialty Exam: Review of Systems  Cardiovascular:  Negative for chest pain.  Neurological:  Negative for tremors.  Psychiatric/Behavioral:  Negative for agitation and self-injury.     There were no vitals taken for this visit.There is no height or weight on file to calculate BMI.  General Appearance: Casual  Eye Contact:  Fair  Speech:  Clear and Coherent  Volume:  Normal  Mood: Improved   Affect:  Congruent  Thought Process:  Goal Directed  Orientation:  Full (Time, Place, and Person)  Thought Content:  Logical  Suicidal Thoughts:  No  Homicidal Thoughts:  No  Memory:  Immediate;   Fair Recent;   Good  Judgement:  Good  Insight:  Fair  Psychomotor Activity:  Normal  Concentration:  Concentration: Fair and Attention Span: Fair  Recall:  [employer] of Knowledge:Good  Language: Good  Akathisia:  No  Handed:    AIMS (if indicated):  not done  Assets:  Communication Skills Desire for Improvement Financial Resources/Insurance Housing  ADL's:  Intact  Cognition: WNL  Sleep:  Fair   Screenings: GAD-7    Flowsheet Row Office Visit from [facility]/22/2021 in [facility]  Total GAD-7 Score 4      PHQ2-9    Flowsheet Row Office Visit from 09/29/2021 in [facility] AT [place] Counselor from 06/08/2021 in [facility] AT [place] Office Visit from 04/28/2021 in [facility] AT [place] Office Visit from 02/22/2021 in [facility] At [facility]  PHQ-2 Total Score 0 1 6 2   PHQ-9 Total Score -- -- 12 3      Flowsheet Row Video Visit from 06/19/2022 in [facility] AT [place] Office Visit from 12/29/2021 in  [facility] AT [place] Office Visit from 09/29/2021 in [facility] AT [place]  C-SSRS RISK CATEGORY No Risk No Risk No Risk       Assessment and Plan: as follows  Prior documentation reviewed  Bipolar per history: last episode depressed; doing stable, continue celexa, lamictal  Insomna:manageable with trazadone, will send refills  No rash or side effect Fu 2m.  11m, MD 7/24/20231:13 PM

## 2022-06-30 ENCOUNTER — Encounter: Payer: Self-pay | Admitting: Family Medicine

## 2022-08-06 ENCOUNTER — Other Ambulatory Visit (HOSPITAL_COMMUNITY): Payer: Self-pay | Admitting: Psychiatry

## 2022-10-16 ENCOUNTER — Telehealth (INDEPENDENT_AMBULATORY_CARE_PROVIDER_SITE_OTHER): Payer: BLUE CROSS/BLUE SHIELD | Admitting: Psychiatry

## 2022-10-16 ENCOUNTER — Encounter (HOSPITAL_COMMUNITY): Payer: Self-pay | Admitting: Psychiatry

## 2022-10-16 DIAGNOSIS — F5102 Adjustment insomnia: Secondary | ICD-10-CM

## 2022-10-16 DIAGNOSIS — F3170 Bipolar disorder, currently in remission, most recent episode unspecified: Secondary | ICD-10-CM

## 2022-10-16 MED ORDER — CITALOPRAM HYDROBROMIDE 10 MG PO TABS: 10.0000 mg | ORAL_TABLET | Freq: Every day | ORAL | 0 refills | Status: DC

## 2022-10-16 MED ORDER — TRAZODONE HCL 50 MG PO TABS: ORAL_TABLET | ORAL | 0 refills | Status: DC

## 2022-10-16 MED ORDER — LAMOTRIGINE 150 MG PO TABS: 150.0000 mg | ORAL_TABLET | Freq: Every day | ORAL | 0 refills | Status: DC

## 2022-10-16 NOTE — Progress Notes (Signed)
[facility] Follow up visit  Patient Identification: Lisa [name] MRN:  [id number] Date of Evaluation:  10/16/2022 Referral Source: Dr. Selena Batten Chief Complaint:  follow up bipolar, sleep Visit Diagnosis:    ICD-10-CM   1. Bipolar affective disorder in remission (HCC)  F31.70     2. Adjustment insomnia  F51.02     Virtual Visit via Video Note  I connected with [name] on 10/16/22 at  1:00 PM EST by a video enabled telemedicine application and verified that I am speaking with the correct person using two identifiers.  Location: Patient: work Provider: home office   I discussed the limitations of evaluation and management by telemedicine and the availability of in person appointments. The patient expressed understanding and agreed to proceed.      I discussed the assessment and treatment plan with the patient. The patient was provided an opportunity to ask questions and all were answered. The patient agreed with the plan and demonstrated an understanding of the instructions.   The patient was advised to call back or seek an in-person evaluation if the symptoms worsen or if the condition fails to improve as anticipated.  I provided 15 minutes of non-face-to-face time during this encounter.    History of Present Illness: Patient is a 31 years old currently single [place] descent female lives with her parents relocated from [place].  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 Selena Batten to establish care for possible bipolar   Doing better, changed job now working at [employer] as Risk analyst. Likes the job  Aggravating factor: relationship breakups in the past, self esteem Modifying factor: parents co workers, sister, gym work out  Duration since young age  Past suicide attempt denies Past drug use or alcohol denies    Past Psychiatric History: depression, anxiet6y  Previous Psychotropic Medications: Yes   Substance Abuse History in the last  12 months:  No.  Consequences of Substance Abuse: NA  Past Medical History: History reviewed. No pertinent past medical history.  Past Surgical History:  Procedure Laterality Date   KNEE ARTHROSCOPY W/ MENISCAL REPAIR      Family Psychiatric History: mom: depression  Family History:  Family History  Problem Relation Age of Onset   Hypertension Mother     Social History:   Social History   Socioeconomic History   Marital status: Single    Spouse name: Not on file   Number of children: Not on file   Years of education: Not on file   Highest education level: Not on file  Occupational History   Occupation: Risk analyst  Tobacco Use   Smoking status: Never   Smokeless tobacco: Never  Vaping Use   Vaping Use: Never used  Substance and Sexual Activity   Alcohol use: Yes    Alcohol/week: 0.0 - 1.0 standard drinks of alcohol   Drug use: Never   Sexual activity: Yes    Birth control/protection: Pill  Other Topics Concern   Not on file  Social History Narrative   Not on file   Social Determinants of Health   Financial Resource Strain: Not on file  Food Insecurity: Not on file  Transportation Needs: Not on file  Physical Activity: Not on file  Stress: Not on file  Social Connections: Not on file   w up with parents, migrated from [place] age 40. Difficult in school adjustment and fear of abondomement   Allergies:  No Known Allergies  Metabolic Disorder Labs: No results  found for: "HGBA1C", "MPG" No results found for: "PROLACTIN" No results found for: "CHOL", "TRIG", "HDL", "CHOLHDL", "VLDL", "LDLCALC" No results found for: "TSH"  Therapeutic Level Labs: No results found for: "LITHIUM" No results found for: "CBMZ" No results found for: "VALPROATE"  Current Medications: Current Outpatient Medications  Medication Sig Dispense Refill   citalopram (CELEXA) 10 MG tablet Take 1 tablet (10 mg total) by mouth daily. 90 tablet 0   lamoTRIgine (LAMICTAL) 150 MG  tablet Take 1 tablet (150 mg total) by mouth daily. 90 tablet 0   traZODone (DESYREL) 50 MG tablet TAKE 1 TABLET EVERY EVENING 90 tablet 0   No current facility-administered medications for this visit.     Psychiatric Specialty Exam: Review of Systems  Cardiovascular:  Negative for chest pain.  Neurological:  Negative for tremors.  Psychiatric/Behavioral:  Negative for agitation and self-injury.     There were no vitals taken for this visit.There is no height or weight on file to calculate BMI.  General Appearance: Casual  Eye Contact:  Fair  Speech:  Clear and Coherent  Volume:  Normal  Mood: Improved   Affect:  Congruent  Thought Process:  Goal Directed  Orientation:  Full (Time, Place, and Person)  Thought Content:  Logical  Suicidal Thoughts:  No  Homicidal Thoughts:  No  Memory:  Immediate;   Fair Recent;   Good  Judgement:  Good  Insight:  Fair  Psychomotor Activity:  Normal  Concentration:  Concentration: Fair and Attention Span: Fair  Recall:  [employer] of Knowledge:Good  Language: Good  Akathisia:  No  Handed:    AIMS (if indicated):  not done  Assets:  Communication Skills Desire for Improvement Financial Resources/Insurance Housing  ADL's:  Intact  Cognition: WNL  Sleep:  Fair   Screenings: GAD-7    Flowsheet Row Office Visit from 02/22/2021 in [facility]  Total GAD-7 Score 4      PHQ2-9    Flowsheet Row Office Visit from 09/29/2021 in [facility] AT [place] Counselor from 06/08/2021 in [facility] AT [place] Office Visit from 04/28/2021 in [facility] AT [place] Office Visit from 02/22/2021 in [facility] At [facility]  PHQ-2 Total Score 0 1 6 2   PHQ-9 Total Score -- -- 12 3      Flowsheet Row Video Visit from 10/16/2022 in [facility] AT [employer] Video Visit from 06/19/2022 in  [facility] CENTER AT [place] Office Visit from 12/29/2021 in [facility] AT [place]  C-SSRS RISK CATEGORY No Risk No Risk No Risk       Assessment and Plan: as follows Prior documentation reviewed  Bipolar per history: last episode depressed; stable continue lamictal and celexa She discussed celexa taper when ready, considering doing stable will re visit idea in spring next year.   Insomna:fair on trazadone 06-18-1991, MD 11/20/20231:09 PM

## 2023-02-12 ENCOUNTER — Telehealth (INDEPENDENT_AMBULATORY_CARE_PROVIDER_SITE_OTHER): Payer: BLUE CROSS/BLUE SHIELD | Admitting: Psychiatry

## 2023-02-12 ENCOUNTER — Encounter (HOSPITAL_COMMUNITY): Payer: Self-pay | Admitting: Psychiatry

## 2023-02-12 DIAGNOSIS — F3170 Bipolar disorder, currently in remission, most recent episode unspecified: Secondary | ICD-10-CM | POA: Diagnosis not present

## 2023-02-12 DIAGNOSIS — F5102 Adjustment insomnia: Secondary | ICD-10-CM

## 2023-02-12 MED ORDER — LAMOTRIGINE 150 MG PO TABS: 150.0000 mg | ORAL_TABLET | Freq: Every day | ORAL | 0 refills | Status: DC

## 2023-02-12 NOTE — Progress Notes (Signed)
[name] Follow up visit  Patient Identification: Lisa [name] MRN:  [id number] Date of Evaluation:  02/12/2023 Referral Source: Dr. Einar Pheasant Chief Complaint:  follow up bipolar, sleep Visit Diagnosis:    ICD-10-CM   1. Bipolar affective disorder in remission ([place])  F31.70     2. Adjustment insomnia  F51.02      Virtual Visit via Video Note  I connected with Lisa [name] on 02/12/23 at  1:00 PM EDT by a video enabled telemedicine application and verified that I am speaking with the correct person using two identifiers.  Location: Patient: work Provider: home office   I discussed the limitations of evaluation and management by telemedicine and the availability of in person appointments. The patient expressed understanding and agreed to proceed.        I discussed the assessment and treatment plan with the patient. The patient was provided an opportunity to ask questions and all were answered. The patient agreed with the plan and demonstrated an understanding of the instructions.   The patient was advised to call back or seek an in-person evaluation if the symptoms worsen or if the condition fails to improve as anticipated.  I provided 15 minutes of non-face-to-face time during this encounter.    History of Present Illness: Patient is a 32 years old currently single [place] [name] descent female lives with her parents relocated from [place].  She is working full-time as a Corporate treasurer initially referred initially by Dr. Einar Pheasant to establish care for possible bipolar   Doing better, has been asking to taper off celexa,  She has good support system, also has changed trazadone to prn   Aggravating factor: relationship breakups in the past, self esteem Modifying factor: parents co workers, sister, gym work out  Duration since young age  Past suicide attempt denies Past drug use or alcohol denies    Past Psychiatric History: depression, anxiet6y  Previous Psychotropic Medications: Yes    Substance Abuse History in the last 12 months:  No.  Consequences of Substance Abuse: NA  Past Medical History: No past medical history on file.  Past Surgical History:  Procedure Laterality Date   KNEE ARTHROSCOPY W/ MENISCAL REPAIR      Family Psychiatric History: mom: depression  Family History:  Family History  Problem Relation Age of Onset   Hypertension Mother     Social History:   Social History   Socioeconomic History   Marital status: Single    Spouse name: Not on file   Number of children: Not on file   Years of education: Not on file   Highest education level: Not on file  Occupational History   Occupation: Corporate treasurer  Tobacco Use   Smoking status: Never   Smokeless tobacco: Never  Vaping Use   Vaping Use: Never used  Substance and Sexual Activity   Alcohol use: Yes    Alcohol/week: 0.0 - 1.0 standard drinks of alcohol   Drug use: Never   Sexual activity: Yes    Birth control/protection: Pill  Other Topics Concern   Not on file  Social History Narrative   Not on file   Social Determinants of Health   Financial Resource Strain: Not on file  Food Insecurity: Not on file  Transportation Needs: Not on file  Physical Activity: Not on file  Stress: Not on file  Social Connections: Not on file   w up with parents, migrated from [place] age 62. Difficult in school adjustment and fear of abondomement   Allergies:  No Known Allergies  Metabolic Disorder Labs: No results found for: "HGBA1C", "MPG" No results found for: "PROLACTIN" No results found for: "CHOL", "TRIG", "HDL", "CHOLHDL", "VLDL", "LDLCALC" No results found for: "TSH"  Therapeutic Level Labs: No results found for: "LITHIUM" No results found for: "CBMZ" No results found for: "VALPROATE"  Current Medications: Current Outpatient Medications  Medication Sig Dispense Refill   lamoTRIgine (LAMICTAL) 150 MG tablet Take 1 tablet (150 mg total) by mouth daily. 90 tablet 0    traZODone (DESYREL) 50 MG tablet TAKE 1 TABLET EVERY EVENING 90 tablet 0   No current facility-administered medications for this visit.     Psychiatric Specialty Exam: Review of Systems  Cardiovascular:  Negative for chest pain.  Neurological:  Negative for tremors.  Psychiatric/Behavioral:  Negative for agitation and self-injury.     There were no vitals taken for this visit.There is no height or weight on file to calculate BMI.  General Appearance: Casual  Eye Contact:  Fair  Speech:  Clear and Coherent  Volume:  Normal  Mood: Improved   Affect:  Congruent  Thought Process:  Goal Directed  Orientation:  Full (Time, Place, and Person)  Thought Content:  Logical  Suicidal Thoughts:  No  Homicidal Thoughts:  No  Memory:  Immediate;   Fair Recent;   Good  Judgement:  Good  Insight:  Fair  Psychomotor Activity:  Normal  Concentration:  Concentration: Fair and Attention Span: Fair  Recall:  [employer] of Knowledge:Good  Language: Good  Akathisia:  No  Handed:    AIMS (if indicated):  not done  Assets:  Communication Skills Desire for Improvement Financial Resources/Insurance Housing  ADL's:  Intact  Cognition: WNL  Sleep:  Fair   Screenings: GAD-7    [place] Office Visit from 02/22/2021 in [place] [facility]  Total GAD-7 Score 4      PHQ2-9    [place] Visit from 09/29/2021 in [place] at [place] from 06/08/2021 in [place] at [place] Visit from 04/28/2021 in [place] at [place] Visit from 02/22/2021 in [place] at [facility]  PHQ-2 Total Score 0 1 6 2   PHQ-9 Total Score -- -- 12 3      Flowsheet Row Video Visit from 10/16/2022 in [place] at [facility] Video Visit from 06/19/2022 in [facility] at [place] Visit from 12/29/2021 in [place] [facility] at [place] No Risk No Risk No Risk       Assessment and Plan: as follows  Prior documentation reviewed  Bipolar per history: last episode depressed; remains stable wants to taper off celexa, discussed risks, will lower to 5mg  for next 2 weeks and then stop Family need to be aware of if symptoms re occur to inform  Continue lamictal, no rash  Insomna:fair, trazadone is prn , not taking regularly  Fu 67m.  Merian Capron, MD 3/18/20241:07 PM

## 2023-05-11 ENCOUNTER — Telehealth (HOSPITAL_COMMUNITY): Payer: BLUE CROSS/BLUE SHIELD | Admitting: Psychiatry

## 2023-06-11 ENCOUNTER — Telehealth (HOSPITAL_COMMUNITY): Payer: BLUE CROSS/BLUE SHIELD | Admitting: Psychiatry

## 2023-06-11 ENCOUNTER — Encounter (HOSPITAL_COMMUNITY): Payer: Self-pay

## 2023-06-11 ENCOUNTER — Telehealth (HOSPITAL_COMMUNITY): Payer: Self-pay | Admitting: Psychiatry

## 2023-06-11 NOTE — Telephone Encounter (Signed)
Called to reschedule today's 1230 appointment due to conflict in provider's schedule. Left voicemail and sent text requesting call back to  reschedule.

## 2023-06-27 ENCOUNTER — Telehealth (INDEPENDENT_AMBULATORY_CARE_PROVIDER_SITE_OTHER): Payer: BLUE CROSS/BLUE SHIELD | Admitting: Psychiatry

## 2023-06-27 ENCOUNTER — Encounter (HOSPITAL_COMMUNITY): Payer: Self-pay | Admitting: Psychiatry

## 2023-06-27 DIAGNOSIS — F3170 Bipolar disorder, currently in remission, most recent episode unspecified: Secondary | ICD-10-CM

## 2023-06-27 DIAGNOSIS — F5102 Adjustment insomnia: Secondary | ICD-10-CM | POA: Diagnosis not present

## 2023-06-27 MED ORDER — LAMOTRIGINE 150 MG PO TABS: 150.0000 mg | ORAL_TABLET | Freq: Every day | ORAL | 0 refills | Status: DC

## 2023-06-27 NOTE — Progress Notes (Signed)
[facility] Follow up visit  Patient Identification: Lisa [name] MRN:  [id number] Date of Evaluation:  06/27/2023 Referral Source: Dr. Selena Batten Chief Complaint:  follow up bipolar,  Visit Diagnosis:    ICD-10-CM   1. Bipolar affective disorder in remission (HCC)  F31.70     2. Adjustment insomnia  F51.02     Virtual Visit via Video Note  I connected with Lisa [name] on 06/27/23 at 2:25 PM by a video enabled telemedicine application and verified that I am speaking with the correct person using two identifiers.  Location: Patient: work Provider: home office    I discussed the limitations of evaluation and management by telemedicine and the availability of in person appointments. The patient expressed understanding and agreed to proceed.   I discussed the assessment and treatment plan with the patient. The patient was provided an opportunity to ask questions and all were answered. The patient agreed with the plan and demonstrated an understanding of the instructions.   The patient was advised to call back or seek an in-person evaluation if the symptoms worsen or if the condition fails to improve as anticipated.  I provided 15 minutes of non-face-to-face time during this encounter.   [name], MD     History of Present Illness: Patient is a 32 years old currently single [place] descent female lives with her parents relocated from [place].  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 Selena Batten to establish care for possible bipolar   She is doing better, have tapered and now off celexa, depression manageable, some work stress but handling it. Is now in a relationship, some stress there but doing fair  Aggravating factor: relationship breakups in the past,  self esteem in past Modifying factor: parents co workers, sister, gym  Duration since young age  Severity improved    Past Psychiatric History: depression, anxiet6y  Previous Psychotropic  Medications: Yes   Substance Abuse History in the last 12 months:  No.  Consequences of Substance Abuse: NA  Past Medical History: History reviewed. No pertinent past medical history.  Past Surgical History:  Procedure Laterality Date   KNEE ARTHROSCOPY W/ MENISCAL REPAIR      Family Psychiatric History: mom: depression  Family History:  Family History  Problem Relation Age of Onset   Hypertension Mother     Social History:   Social History   Socioeconomic History   Marital status: Single    Spouse name: Not on file   Number of children: Not on file   Years of education: Not on file   Highest education level: Not on file  Occupational History   Occupation: Risk analyst  Tobacco Use   Smoking status: Never   Smokeless tobacco: Never  Vaping Use   Vaping status: Never Used  Substance and Sexual Activity   Alcohol use: Yes    Alcohol/week: 0.0 - 1.0 standard drinks of alcohol   Drug use: Never   Sexual activity: Yes    Birth control/protection: Pill  Other Topics Concern   Not on file  Social History Narrative   Not on file   Social Determinants of Health   Financial Resource Strain: Low Risk  (12/11/2022)   [facility], [facility]   Overall Financial Resource Strain (CARDIA)    Difficulty of Paying Living Expenses: Not very hard  Food Insecurity: No Food Insecurity (12/11/2022)   Received from [facility], [facility]   Hunger Vital Sign    Worried  About Running Out of Food in the Last Year: Never true    Ran Out of Food in the Last Year: Never true  Transportation Needs: No Transportation Needs (12/11/2022)   [facility]   [facility] - Transportation    Lack of Transportation (Medical): No    Lack of Transportation (Non-Medical): No  Physical Activity: Sufficiently Active (12/08/2022)   Received from [facility], [facility]   Exercise Vital Sign    Days of Exercise per Week: 3 days    Minutes of  Exercise per Session: 50 min  Stress: Stress Concern Present (12/08/2022)   Received from [facility], [facility] of Occupational Health - Occupational Stress Questionnaire    Feeling of Stress : To some extent  Social Connections: Moderately Integrated (12/08/2022)   [facility], [facility]   Social Network    How would you rate your social network (family, work, friends)?: Adequate participation with social networks   w up with parents, migrated from [place] age 15. Difficult in school adjustment and fear of abondomement   Allergies:  No Known Allergies  Metabolic Disorder Labs: No results found for: "HGBA1C", "MPG" No results found for: "PROLACTIN" No results found for: "CHOL", "TRIG", "HDL", "CHOLHDL", "VLDL", "LDLCALC" No results found for: "TSH"  Therapeutic Level Labs: No results found for: "LITHIUM" No results found for: "CBMZ" No results found for: "VALPROATE"  Current Medications: Current Outpatient Medications  Medication Sig Dispense Refill   lamoTRIgine (LAMICTAL) 150 MG tablet Take 1 tablet (150 mg total) by mouth daily. 90 tablet 0   No current facility-administered medications for this visit.     Psychiatric Specialty Exam: Review of Systems  Cardiovascular:  Negative for chest pain.  Neurological:  Negative for tremors.  Psychiatric/Behavioral:  Negative for agitation and self-injury.     There were no vitals taken for this visit.There is no height or weight on file to calculate BMI.  General Appearance: Casual  Eye Contact:  Fair  Speech:  Clear and Coherent  Volume:  Normal  Mood: Improved   Affect:  Congruent  Thought Process:  Goal Directed  Orientation:  Full (Time, Place, and Person)  Thought Content:  Logical  Suicidal Thoughts:  No  Homicidal Thoughts:  No  Memory:  Immediate;   Fair Recent;   Good  Judgement:  Good  Insight:  Fair  Psychomotor Activity:  Normal  Concentration:  Concentration:  Fair and Attention Span: Fair  Recall:  [employer] of Knowledge:Good  Language: Good  Akathisia:  No  Handed:    AIMS (if indicated):  not done  Assets:  Communication Skills Desire for Improvement Financial Resources/Insurance Housing  ADL's:  Intact  Cognition: WNL  Sleep:  Fair   Screenings: GAD-7    Flowsheet Row Office Visit from 02/22/2021 in [facility] Primary Care & Sports Medicine at [facility]  Total GAD-7 Score 4      PHQ2-9    Flowsheet Row Office Visit from 09/29/2021 in [facility] at [facility] Counselor from 06/08/2021 in [facility] Behavioral Health at [facility] Office Visit from 04/28/2021 in [facility] Behavior[facility] at [facility] Office Visit from 02/22/2021 in [facility] Primary Care & Sports Medicine at [facility] Total Score 0 1 6 2   PHQ-9 Total Score -- -- 12 3      Flowsheet Row Video Visit from 10/16/2022 in [facility] Outpatient  Behavioral Health at [facility] Video Visit from 06/19/2022 in [facility] Behavior[facility] at [facility] Office Visit from 12/29/2021 in [facility] Behavior[facility] at [facility]  C-SSRS RISK CATEGORY No Risk No Risk No Risk       Assessment and Plan: as follows  Prior documentation reviewed  Bipolar per history: last episode depressed; stable continue lamictal. No rash. Have tapered off celexa   Insomna: handling it fair, not taking trazadone so we can discontinue. Continue sleep hygiene  Fu 81m. Renewed meds if were due  Fu 24m.  [name], MD 7/31/20242:31 PM

## 2023-09-16 ENCOUNTER — Other Ambulatory Visit (HOSPITAL_COMMUNITY): Payer: Self-pay | Admitting: Psychiatry

## 2023-10-19 ENCOUNTER — Encounter (HOSPITAL_COMMUNITY): Payer: Self-pay | Admitting: Psychiatry

## 2023-10-19 ENCOUNTER — Telehealth (INDEPENDENT_AMBULATORY_CARE_PROVIDER_SITE_OTHER): Payer: BLUE CROSS/BLUE SHIELD | Admitting: Psychiatry

## 2023-10-19 DIAGNOSIS — F5102 Adjustment insomnia: Secondary | ICD-10-CM

## 2023-10-19 DIAGNOSIS — F3170 Bipolar disorder, currently in remission, most recent episode unspecified: Secondary | ICD-10-CM | POA: Diagnosis not present

## 2023-10-19 MED ORDER — LAMOTRIGINE 150 MG PO TABS: 150.0000 mg | ORAL_TABLET | Freq: Every day | ORAL | 0 refills | Status: DC

## 2023-10-19 NOTE — Progress Notes (Signed)
[facility] Follow up visit  Patient Identification: Lisa [name] MRN:  [id number] Date of Evaluation:  10/19/2023 Referral Source: Dr. Selena Batten Chief Complaint:  follow up bipolar,  Visit Diagnosis:    ICD-10-CM   1. Bipolar affective disorder in remission (HCC)  F31.70     2. Adjustment insomnia  F51.02      Virtual Visit via Video Note  I connected with Lisa [name] on 10/19/23 at 12:30 PM EST by a video enabled telemedicine application and verified that I am speaking with the correct person using two identifiers.  Location: Patient: home  Provider: home office   I discussed the limitations of evaluation and management by telemedicine and the availability of in person appointments. The patient expressed understanding and agreed to proceed.      I discussed the assessment and treatment plan with the patient. The patient was provided an opportunity to ask questions and all were answered. The patient agreed with the plan and demonstrated an understanding of the instructions.   The patient was advised to call back or seek an in-person evaluation if the symptoms worsen or if the condition fails to improve as anticipated.  I provided 18 minutes of non-face-to-face time during this encounter.  History of Present Illness: Patient is a 32 years old currently single [place] descent female lives with her parents relocated from [place].  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 Selena Batten to establish care for possible bipolar   Patient doing fair and without celexa or trazadone, sleeps fair and is on lamictal for mood, no rash . Mood is balanced, and no particular stress  Family is supportive  Aggravating factor: relationship breakups in the past,  self esteem in past Modifying factor: parents co workers, sister, gym  Duration since young age  Severity improved    Past Psychiatric History: depression, anxiet6y  Previous Psychotropic Medications: Yes    Substance Abuse History in the last 12 months:  No.  Consequences of Substance Abuse: NA  Past Medical History: History reviewed. No pertinent past medical history.  Past Surgical History:  Procedure Laterality Date   KNEE ARTHROSCOPY W/ MENISCAL REPAIR      Family Psychiatric History: mom: depression  Family History:  Family History  Problem Relation Age of Onset   Hypertension Mother     Social History:   Social History   Socioeconomic History   Marital status: Single    Spouse name: Not on file   Number of children: Not on file   Years of education: Not on file   Highest education level: Not on file  Occupational History   Occupation: Risk analyst  Tobacco Use   Smoking status: Never   Smokeless tobacco: Never  Vaping Use   Vaping status: Never Used  Substance and Sexual Activity   Alcohol use: Yes    Alcohol/week: 0.0 - 1.0 standard drinks of alcohol   Drug use: Never   Sexual activity: Yes    Birth control/protection: Pill  Other Topics Concern   Not on file  Social History Narrative   Not on file   Social Determinants of Health   Financial Resource Strain: Low Risk  (12/11/2022)   [facility], [facility]   Overall Financial Resource Strain (CARDIA)    Difficulty of Paying Living Expenses: Not very hard  Food Insecurity: No Food Insecurity (12/11/2022)   Received from [facility], [facility]   Hunger Vital Sign    Worried About Running  Out of Food in the Last Year: Never true    Ran Out of Food in the Last Year: Never true  Transportation Needs: No Transportation Needs (12/11/2022)   [facility], [facility]   [facility] - Transportation    Lack of Transportation (Medical): No    Lack of Transportation (Non-Medical): No  Physical Activity: Sufficiently Active (12/08/2022)   Received from [facility], [facility]   Exercise Vital Sign    Days of Exercise per Week: 3 days    Minutes of Exercise per  Session: 50 min  Stress: Stress Concern Present (12/08/2022)   Received from [facility] of Occupational Health - Occupational Stress Questionnaire    Feeling of Stress : To some extent  Social Connections: Moderately Integrated (12/08/2022)   [facility], [facility]   Social Network    How would you rate your social network (family, work, friends)?: Adequate participation with social networks   w up with parents, migrated from [place] age 41. Difficult in school adjustment and fear of abondomement   Allergies:  No Known Allergies  Metabolic Disorder Labs: No results found for: "HGBA1C", "MPG" No results found for: "PROLACTIN" No results found for: "CHOL", "TRIG", "HDL", "CHOLHDL", "VLDL", "LDLCALC" No results found for: "TSH"  Therapeutic Level Labs: No results found for: "LITHIUM" No results found for: "CBMZ" No results found for: "VALPROATE"  Current Medications: Current Outpatient Medications  Medication Sig Dispense Refill   lamoTRIgine (LAMICTAL) 150 MG tablet Take 1 tablet (150 mg total) by mouth daily. 90 tablet 0   No current facility-administered medications for this visit.     Psychiatric Specialty Exam: Review of Systems  Cardiovascular:  Negative for chest pain.  Neurological:  Negative for tremors.  Psychiatric/Behavioral:  Negative for agitation and self-injury.     There were no vitals taken for this visit.There is no height or weight on file to calculate BMI.  General Appearance: Casual  Eye Contact:  Fair  Speech:  Clear and Coherent  Volume:  Normal  Mood: Improved   Affect:  Congruent  Thought Process:  Goal Directed  Orientation:  Full (Time, Place, and Person)  Thought Content:  Logical  Suicidal Thoughts:  No  Homicidal Thoughts:  No  Memory:  Immediate;   Fair Recent;   Good  Judgement:  Good  Insight:  Fair  Psychomotor Activity:  Normal  Concentration:  Concentration: Fair and  Attention Span: Fair  Recall:  [employer] of Knowledge:Good  Language: Good  Akathisia:  No  Handed:    AIMS (if indicated):  not done  Assets:  Communication Skills Desire for Improvement Financial Resources/Insurance Housing  ADL's:  Intact  Cognition: WNL  Sleep:  Fair   Screenings: GAD-7    Flowsheet Row Office Visit from 02/22/2021 in [facility] Primary Care & Sports Medicine at [facility]  Total GAD-7 Score 4      PHQ2-9    Flowsheet Row Office Visit from 09/29/2021 in [facility] at [facility] Counselor from 06/08/2021 in [facility] Outpatient Behavioral Health at [facility] Office Visit from 04/28/2021 in [facility] Outpatient Behavior[facility] at [facility] Office Visit from 02/22/2021 in [facility] Primary Care & Sports Medicine at [facility] Total Score 0 1 6 2   PHQ-9 Total Score -- -- 12 3      Flowsheet Row Video Visit from 10/16/2022 in [facility] Out[facility] Behavioral [facility]  at [facility] Video Visit from 06/19/2022 in [facility] Outpatient Behavioral Health at [facility] Office Visit from 12/29/2021 in [facility] Outpatient Behavioral Health at [facility]  C-SSRS RISK CATEGORY No Risk No Risk No Risk       Assessment and Plan: as follows  Prior documentation reviewed  Bipolar per history:  in remission. Mood is balanced, continue lamictal   Insomna: handling it fair, did not endorse concerns, continue sleep hygiene  Anixety : denies any excessive anxiety without celexa. Doing fair   Fu 70m.  [name], MD 11/22/202412:35 PM

## 2023-12-24 ENCOUNTER — Encounter: Payer: Self-pay | Admitting: Obstetrics & Gynecology

## 2023-12-24 ENCOUNTER — Ambulatory Visit (INDEPENDENT_AMBULATORY_CARE_PROVIDER_SITE_OTHER): Payer: Self-pay | Admitting: Obstetrics & Gynecology

## 2023-12-24 ENCOUNTER — Other Ambulatory Visit (HOSPITAL_COMMUNITY)
Admission: RE | Admit: 2023-12-24 | Discharge: 2023-12-24 | Disposition: A | Discharge status: Home / Self Care | Payer: BLUE CROSS/BLUE SHIELD | Source: Ambulatory Visit | Attending: Obstetrics & Gynecology | Admitting: Obstetrics & Gynecology

## 2023-12-24 VITALS — BP 112/78 | HR 82 | Ht 65.0 in | Wt 182.0 lb

## 2023-12-24 DIAGNOSIS — Z01419 Encounter for gynecological examination (general) (routine) without abnormal findings: Secondary | ICD-10-CM | POA: Diagnosis present

## 2023-12-24 NOTE — Progress Notes (Signed)
Last Mammogram: n/a Last Pap Smear:  2022- neg Last Colon Screening;  n/a Seat Belts:   yes Sun Screen:   yes Dental Check Up:  yes Brush & Floss:  yes  Subjective:     Lisa [name] is a 34 y.o. female here for a routine exam.  Current complaints: none; first visit to gynecologist.  PCP usually does pap smears. Gynecologic History Patient's last menstrual period was 12/08/2023. Contraception: abstinence Last Mammogram: n/a Last Pap Smear:  2022- neg Last Colon Screening;  n/a Seat Belts:   yes Sun Screen:   yes Dental Check Up:  yes Brush & Floss:  yes   Obstetric History OB History  Gravida Para Term Preterm AB Living  0 0 0 0 0 0  SAB IAB Ectopic Multiple Live Births  0 0 0 0 0     The following portions of the patient's history were reviewed and updated as appropriate: allergies, current medications, past family history, past medical history, past social history, past surgical history, and problem list.  Review of Systems Pertinent items noted in HPI and remainder of comprehensive ROS otherwise negative.    Objective:   Vitals:   12/24/23 0918  BP: 112/78  Pulse: 82  Weight: 182 lb (82.6 kg)  Height: 5\' 5"  (1.651 m)   Vitals:  WNL General appearance: alert, cooperative and no distress  HEENT: Normocephalic, without obvious abnormality, atraumatic Eyes: negative Throat: lips, mucosa, and tongue normal; teeth and gums normal  Respiratory: Clear to auscultation bilaterally  CV: Regular rate and rhythm  Breasts:  Declined exam  GI: Soft, non-tender; bowel sounds normal; no masses,  no organomegaly  GU: External Genitalia:  Tanner V, no lesion Urethra:  No prolapse   Vagina: Pink, normal rugae, no blood or discharge  Cervix: No CMT, no lesion  Uterus:  Normal size and contour, non tender  Adnexa: Normal, no masses, non tender  Musculoskeletal: No edema, redness or tenderness in the calves or thighs  Skin: No lesions or rash  Lymphatic: Axillary adenopathy:  none     Psychiatric: Normal mood and behavior      Assessment:    Healthy female exam.    Plan:   Pap with co testing Mammogram age 62 Family hx unremarkable Not sexually active; no concerns with menstrual cycle.  RTC 1 year.

## 2023-12-27 ENCOUNTER — Encounter: Payer: Self-pay | Admitting: Obstetrics & Gynecology

## 2023-12-27 LAB — CYTOLOGY - PAP
Comment: NEGATIVE
Diagnosis: UNDETERMINED — AB
High risk HPV: NEGATIVE

## 2024-02-18 IMAGING — US US EXTREM LOW VENOUS*L*
1 series · 14 of 24 positions shown · non-contrast
Comparison: None.

CLINICAL DATA: Left leg swelling and tightness for 2 months.

EXAM:
LEFT LOWER EXTREMITY VENOUS DOPPLER ULTRASOUND
TECHNIQUE: Gray-scale sonography with compression, as well as color and duplex
ultrasound, were performed to evaluate the deep venous system(s)
from the level of the common femoral vein through the popliteal and
proximal calf veins.

[Series 1: us venous img lower uni left (dvt) · portal-venous · 14 of 41 slices shown]
[im 1/41]
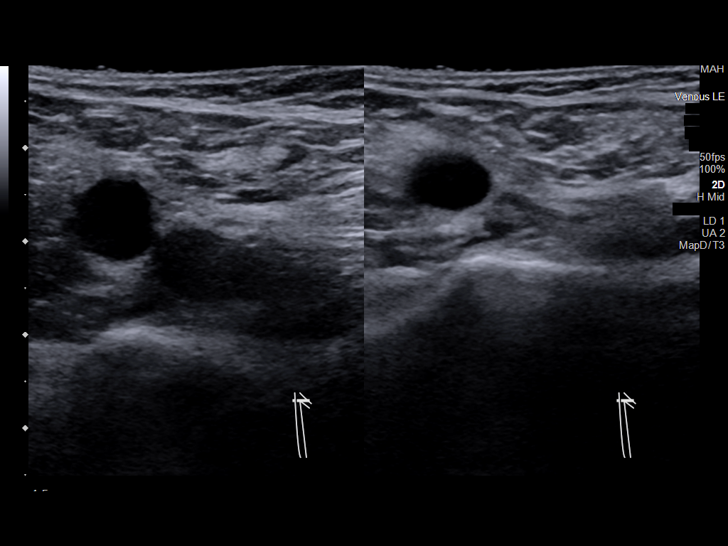
[im 4/41]
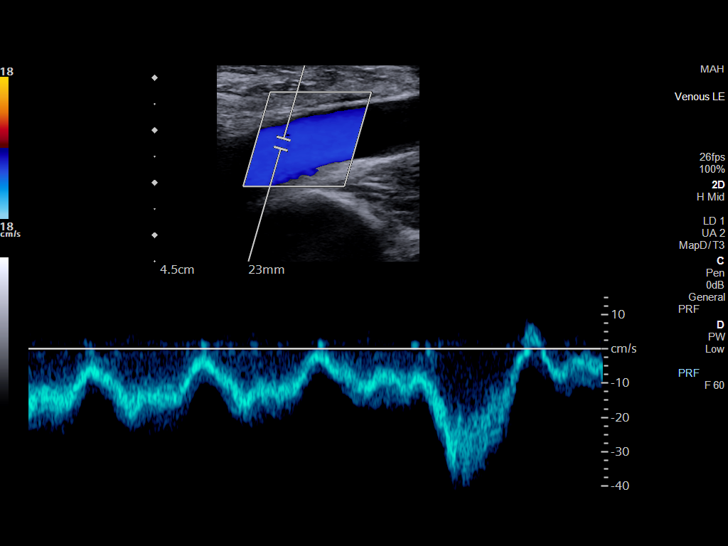
[im 7/41]
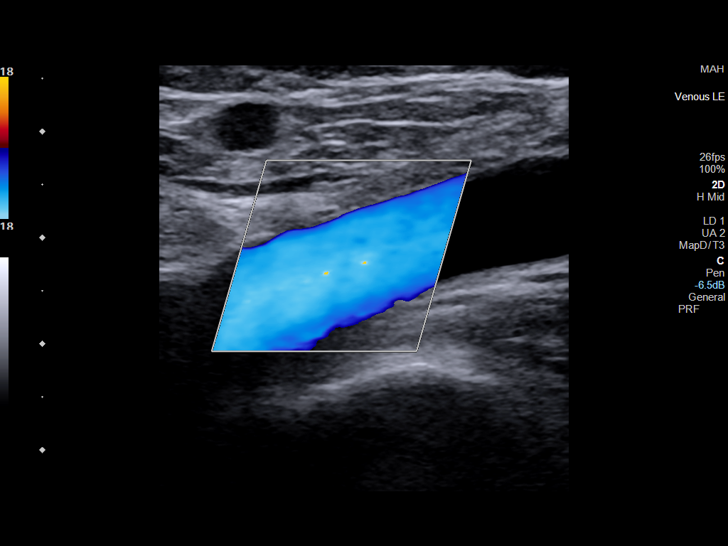
[im 11/41]
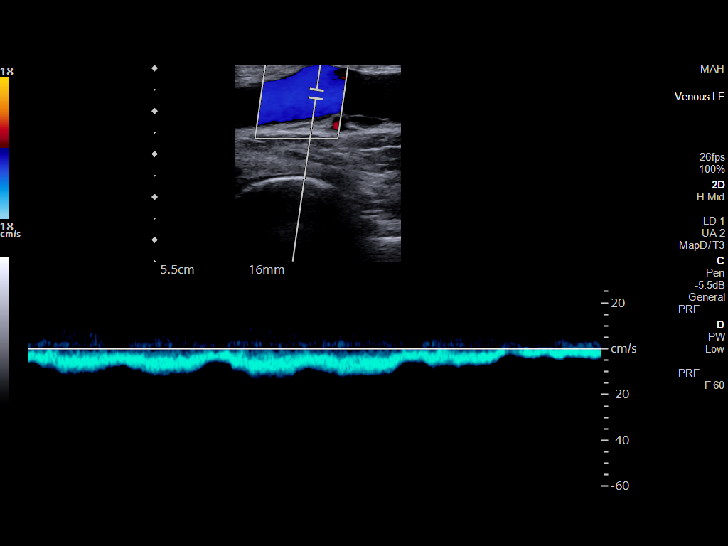
[im 13/41]
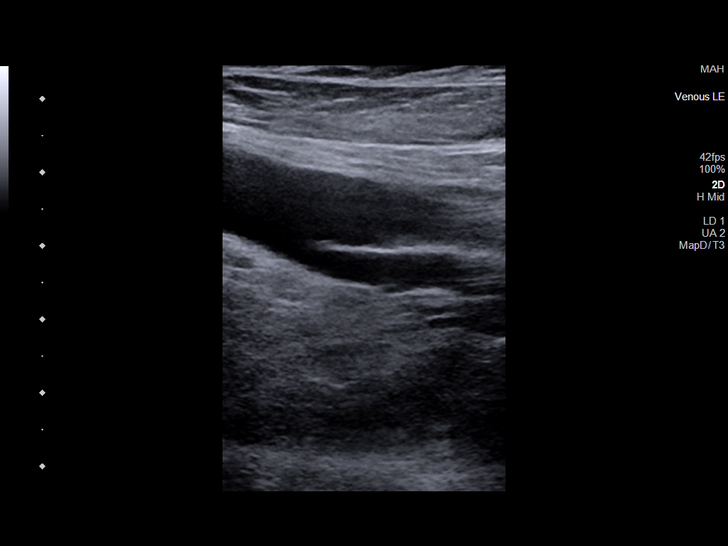
[im 16/41]
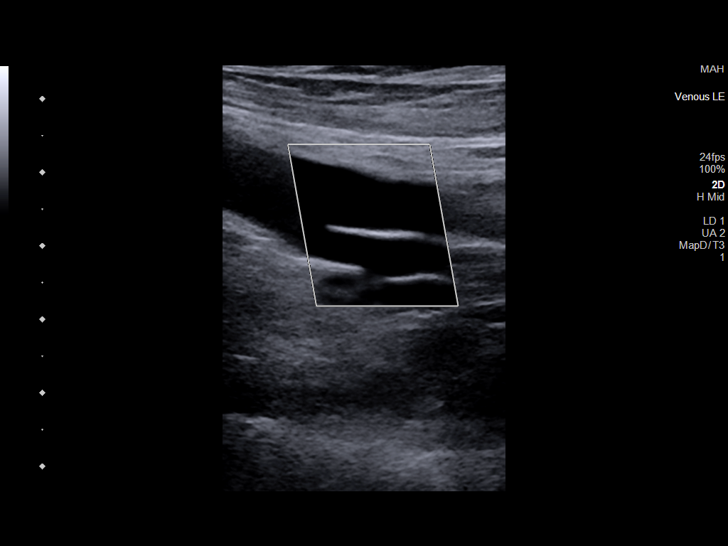
[im 20/41]
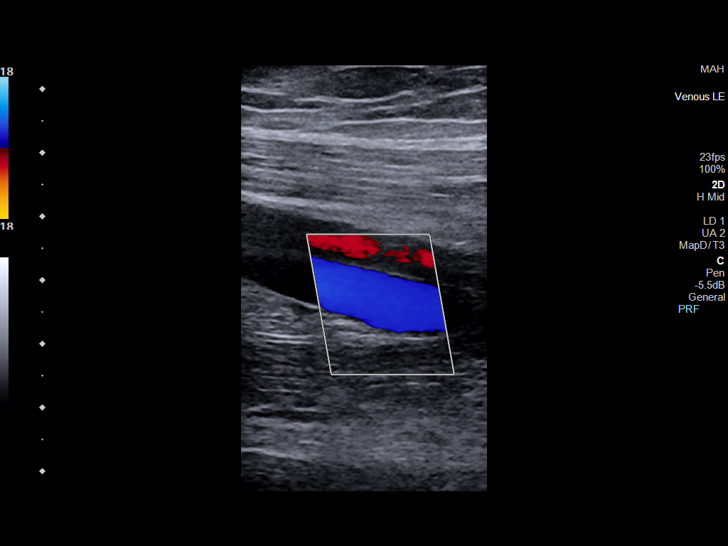
[im 21/41]
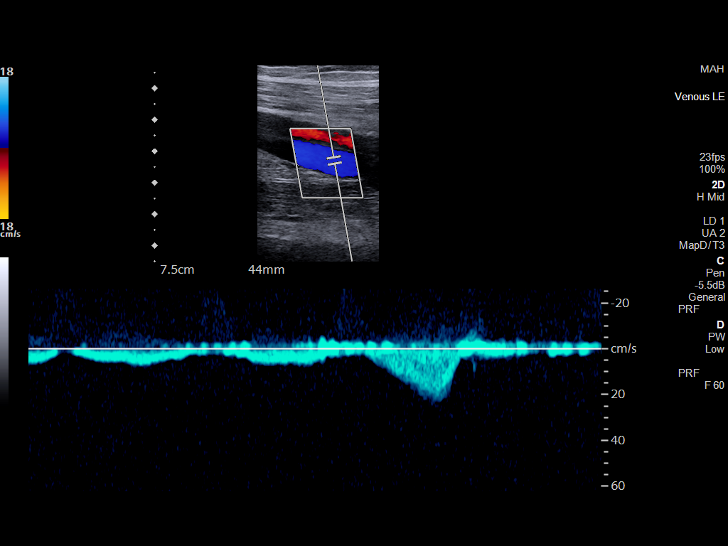
[im 25/41]
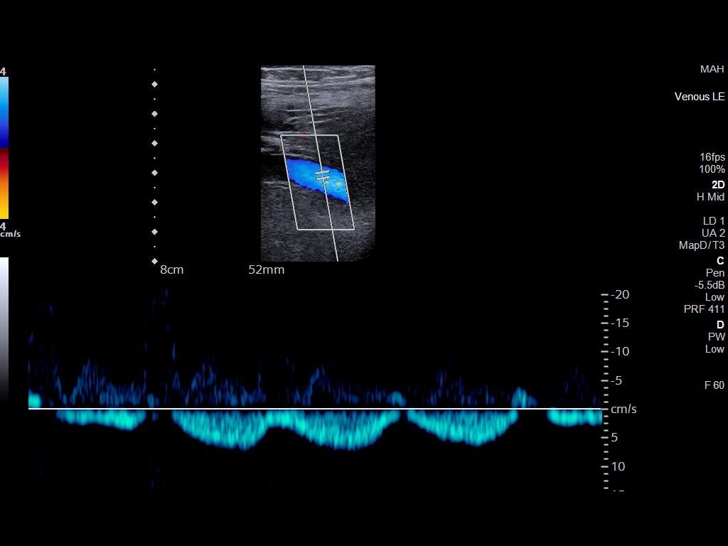
[im 28/41]
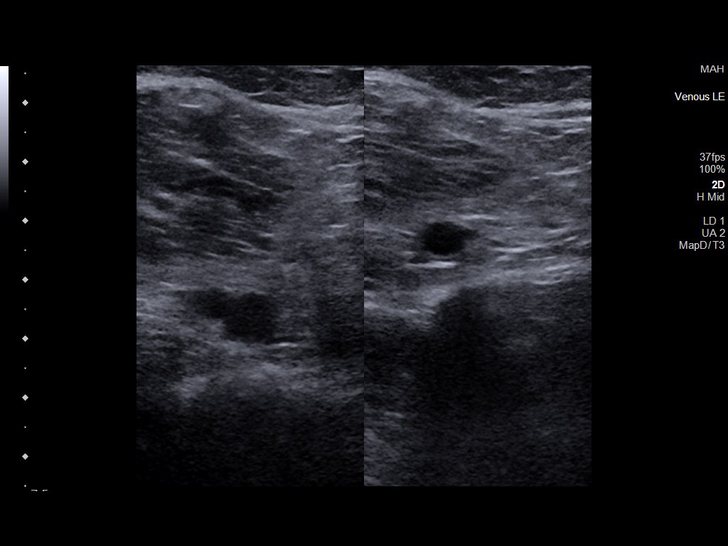
[im 32/41]
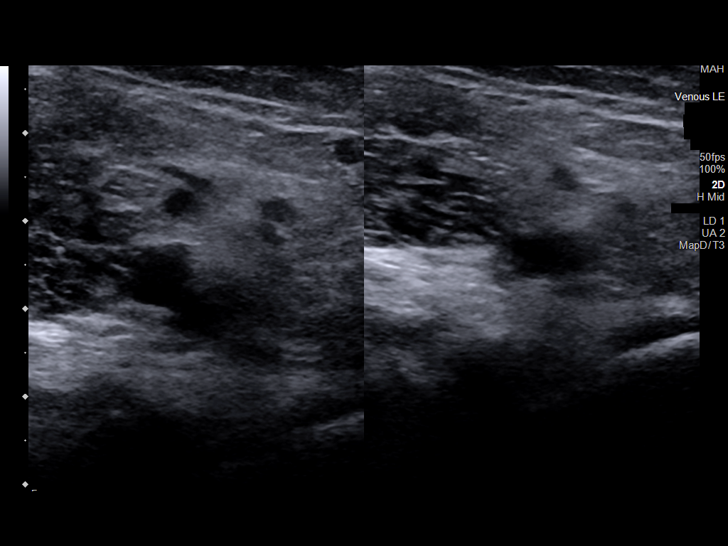
[im 34/41]
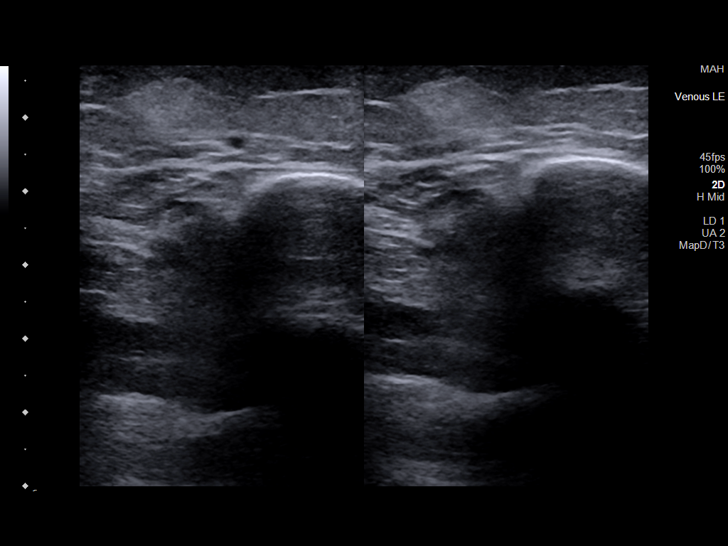
[im 37/41]
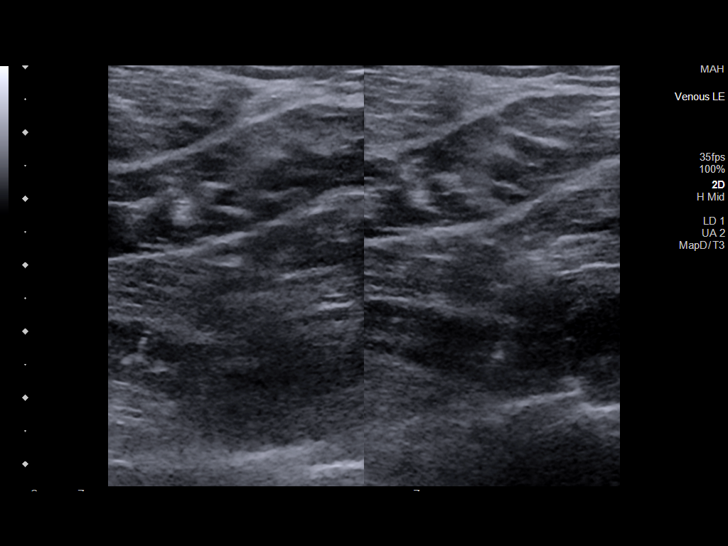
[im 41/41]
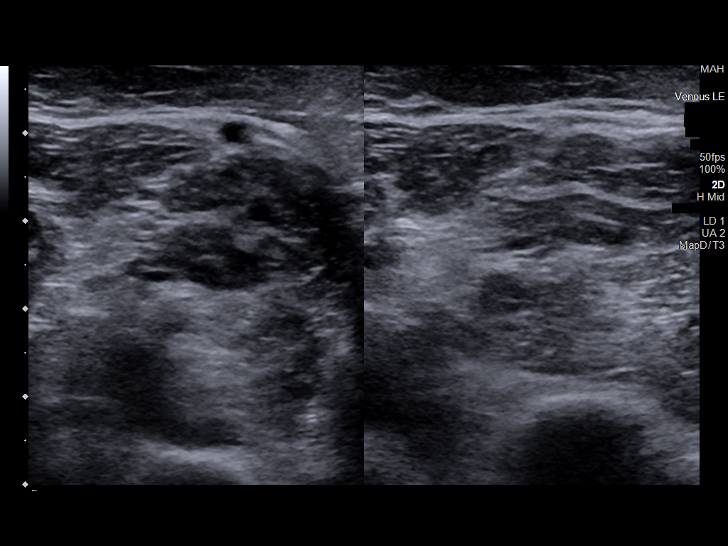

[14 of 24 positions shown; findings below may reference images not displayed]

FINDINGS: VENOUS

Normal compressibility of the common femoral, superficial femoral,
and popliteal veins, as well as the visualized calf veins.
Visualized portions of profunda femoral vein and great saphenous
vein unremarkable. No filling defects to suggest DVT on grayscale or
color Doppler imaging. Doppler waveforms show normal direction of
venous flow, normal respiratory plasticity and response to
augmentation.

Limited views of the contralateral common femoral vein are
unremarkable.

OTHER

None.

Limitations: none
IMPRESSION: Negative.

## 2024-03-11 ENCOUNTER — Telehealth (HOSPITAL_COMMUNITY): Payer: Self-pay | Admitting: *Deleted

## 2024-03-11 MED ORDER — LAMOTRIGINE 150 MG PO TABS: 150.0000 mg | ORAL_TABLET | Freq: Every day | ORAL | 0 refills | Status: DC

## 2024-03-11 NOTE — Addendum Note (Signed)
 Addended by: Maryana Pittmon on: 03/11/2024 08:30 AM   Modules accepted: Orders

## 2024-03-11 NOTE — Telephone Encounter (Signed)
 Rx Request  [facility] [id number] - [place], [place] - [address]    LamoTRIgine (LAMICTAL) 150 MG tablet -- LAST Fill 12-11-23  NEXT APPT 04/18/24 LAST APPT 10/19/23

## 2024-04-18 ENCOUNTER — Telehealth (HOSPITAL_COMMUNITY): Payer: BLUE CROSS/BLUE SHIELD | Admitting: Psychiatry

## 2024-04-30 ENCOUNTER — Telehealth (INDEPENDENT_AMBULATORY_CARE_PROVIDER_SITE_OTHER): Admitting: Psychiatry

## 2024-04-30 ENCOUNTER — Encounter (HOSPITAL_COMMUNITY): Payer: Self-pay | Admitting: Psychiatry

## 2024-04-30 DIAGNOSIS — F5102 Adjustment insomnia: Secondary | ICD-10-CM | POA: Diagnosis not present

## 2024-04-30 DIAGNOSIS — F3170 Bipolar disorder, currently in remission, most recent episode unspecified: Secondary | ICD-10-CM

## 2024-04-30 MED ORDER — LAMOTRIGINE 150 MG PO TABS: 150.0000 mg | ORAL_TABLET | Freq: Every day | ORAL | 0 refills | Status: DC

## 2024-04-30 NOTE — Progress Notes (Signed)
 [facility] Follow up visit  Patient Identification: Lisa [name] MRN:  [id number] Date of Evaluation:  04/30/2024 Referral Source: Dr. Aletha Hutching Chief Complaint:  follow up bipolar,  Visit Diagnosis:    ICD-10-CM   1. Bipolar affective disorder in remission (HCC)  F31.70     2. Adjustment insomnia  F51.02     Virtual Visit via Video Note  I connected with Lisa [name] on 04/30/24 at  9:00 AM EDT by a video enabled telemedicine application and verified that I am speaking with the correct person using two identifiers.  Location: Patient: home Provider: home office   I discussed the limitations of evaluation and management by telemedicine and the availability of in person appointments. The patient expressed understanding and agreed to proceed.   I discussed the assessment and treatment plan with the patient. The patient was provided an opportunity to ask questions and all were answered. The patient agreed with the plan and demonstrated an understanding of the instructions.   The patient was advised to call back or seek an in-person evaluation if the symptoms worsen or if the condition fails to improve as anticipated.  I provided 15 minutes of non-face-to-face time during this encounter.   History of Present Illness: Patient is a 33 years old currently single [place] descent female lives with her parents relocated from [place] .  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 Aletha Hutching to establish care for possible bipolar   Patient is doing fairly well she has moved out and living in her own home she is more independent work is going on fine there is no reported side effects on the medication  Self-esteem is improved.  Depression has improved as well she does yoga and keeps herself busy  Aggravating factor: relationship breakups in the past,  self esteem in past Modifying factor: parents co workers, yoga  Duration since young age  Severity improved    Past  Psychiatric History: depression, [id number]  Previous Psychotropic Medications: Yes   Substance Abuse History in the last 12 months:  No.  Consequences of Substance Abuse: NA  Past Medical History:  Past Medical History:  Diagnosis Date   Bipolar 1 disorder (HCC)     Past Surgical History:  Procedure Laterality Date   KNEE ARTHROSCOPY W/ MENISCAL REPAIR      Family Psychiatric History: mom: depression  Family History:  Family History  Problem Relation Age of Onset   Hypertension Mother     Social History:   Social History   Socioeconomic History   Marital status: Single    Spouse name: Not on file   Number of children: Not on file   Years of education: Not on file   Highest education level: Not on file  Occupational History   Occupation: Risk analyst  Tobacco Use   Smoking status: Never   Smokeless tobacco: Never  Vaping Use   Vaping status: Never Used  Substance and Sexual Activity   Alcohol use: Yes    Alcohol/week: 0.0 - 1.0 standard drinks of alcohol   Drug use: Never   Sexual activity: Not Currently    Birth control/protection: Abstinence  Other Topics Concern   Not on file  Social History Narrative   Not on file   Social Drivers of Health   Financial Resource Strain: Low Risk  (12/11/2022)   Received from [facility]   Overall Financial Resource Strain (CARDIA)    Difficulty of Paying Living Expenses: Not very hard  Food Insecurity: No Food Insecurity (12/11/2022)   Received from [facility]   Hunger Vital Sign    Worried About Running Out of Food in the Last Year: Never true    Ran Out of Food in the Last Year: Never true  Transportation Needs: No Transportation Needs (12/11/2022)   Received from [facility]   [facility] - Transportation    Lack of Transportation (Medical): No    Lack of Transportation (Non-Medical): No  Physical Activity: Sufficiently Active (12/08/2022)   Received from [facility]   Exercise Vital Sign    Days of Exercise per Week: 3 days    Minutes of Exercise per Session: 50 min  Stress: Stress Concern Present (12/08/2022)   Received from [facility], [facility] Occupational Health - Occupational Stress Questionnaire    Feeling of Stress : To some extent  Social Connections: Moderately Integrated (12/08/2022)   [facility] from [facility]   Social Network    How would you rate your social network (family, work, friends)?: Adequate participation with social networks   w up with parents, migrated from [place] age 33. Difficult in school adjustment and fear of abondomement   Allergies:  No Known Allergies  Metabolic Disorder Labs: No results found for: "HGBA1C", "MPG" No results found for: "PROLACTIN" No results found for: "CHOL", "TRIG", "HDL", "CHOLHDL", "VLDL", "LDLCALC" No results found for: "TSH"  Therapeutic Level Labs: No results found for: "LITHIUM" No results found for: "CBMZ" No results found for: "VALPROATE"  Current Medications: Current Outpatient Medications  Medication Sig Dispense Refill   lamoTRIgine  (LAMICTAL ) 150 MG tablet Take 1 tablet (150 mg total) by mouth daily. 90 tablet 0   No current facility-administered medications for this visit.     Psychiatric Specialty Exam: Review of Systems  Cardiovascular:  Negative for chest pain.  Neurological:  Negative for tremors.  Psychiatric/Behavioral:  Negative for agitation and self-injury.     There were no vitals taken for this visit.There is no height or weight on file to calculate BMI.  General Appearance: Casual  Eye Contact:  Fair  Speech:  Clear and Coherent  Volume:  Normal  Mood: Improved   Affect:  Congruent  Thought Process:  Goal Directed  Orientation:  Full (Time, Place, and Person)  Thought Content:  Logical  Suicidal Thoughts:  No  Homicidal Thoughts:  No  Memory:  Immediate;   Fair Recent;   Good   Judgement:  Good  Insight:  Fair  Psychomotor Activity:  Normal  Concentration:  Concentration: Fair and Attention Span: Fair  Recall:  [employer] of Knowledge:Good  Language: Good  Akathisia:  No  Handed:    AIMS (if indicated):  not done  Assets:  Communication Skills Desire for Improvement Financial Resources/Insurance Housing  ADL's:  Intact  Cognition: WNL  Sleep:  Fair   Screenings: GAD-7    Flowsheet Row Office Visit from 02/22/2021 in [facility] & [facility] Medicine at [facility]  Total GAD-7 Score 4      PHQ2-9    Flowsheet Row Office Visit from 09/29/2021 in [facility] at [facility] Counselor from 06/08/2021 in [facility] at [facility] Office Visit from 04/28/2021 in [facility] at [facility] Office Visit from 02/22/2021 in [facility] Primary Care & Sports Medicine at [facility] Total Score 0 1 6 2  PHQ-9 Total Score -- -- 12 3      Flowsheet Row Video Visit from 10/16/2022 in [facility] Outpatient Behavioral Health at [facility] Video Visit from 06/19/2022 in [facility] Health at [facility] Office Visit from 12/29/2021 in [facility] Out[facility] Behavioral Health at [facility]  C-SSRS RISK CATEGORY No Risk No Risk No Risk       Assessment and Plan: as follows Prior documentation reviewed  Bipolar per history:  in remission.  Mood is stable continue Lamictal  no rash reported  Insomna: handling it fair, did not endorse concerns, continue sleep hygiene  Anixety : Manageable without citalopram  continue distraction techniques and keeping herself active  Refill of Lamictal  sent Fu 24m.  Wray Heady, MD 6/4/20259:09 AM

## 2024-07-17 ENCOUNTER — Ambulatory Visit
Admission: EM | Admit: 2024-07-17 | Discharge: 2024-07-17 | Disposition: A | Discharge status: Home / Self Care | Attending: Emergency Medicine | Admitting: Emergency Medicine

## 2024-07-17 ENCOUNTER — Other Ambulatory Visit: Payer: Self-pay

## 2024-07-17 DIAGNOSIS — R300 Dysuria: Secondary | ICD-10-CM | POA: Insufficient documentation

## 2024-07-17 LAB — POCT URINE DIPSTICK
Bilirubin, UA: NEGATIVE
Blood, UA: NEGATIVE
Glucose, UA: NEGATIVE mg/dL
Ketones, POC UA: NEGATIVE mg/dL
Leukocytes, UA: NEGATIVE
Nitrite, UA: NEGATIVE
Protein Ur, POC: NEGATIVE mg/dL
Spec Grav, UA: <=1.005 — AB (ref 1.010–1.025)
Urobilinogen, UA: 0.2 U/dL
pH, UA: 6 (ref 5.0–8.0)

## 2024-07-17 NOTE — ED Provider Notes (Signed)
 [facility]    CSN: [id number] Arrival date & time: 07/17/24  1350    HISTORY   Chief Complaint  Patient presents with   Dysuria   HPI Lisa [name] is a pleasant, 33 y.o. female who presents to urgent care today. Patient complains of burning with urination, increased frequency of urination and bilateral lower back pain.  Patient states her symptoms began this morning.  Patient states she think she has a urinary tract infection.  Patient states has been taking Tylenol for her symptoms which has not provided any relief.  Patient also complains of fatigue and chills.  Patient has normal vital signs on arrival today and appears to be in no acute distress.  Of note, urinalysis performed today was completely normal.  The history is provided by the patient.  Dysuria   Past Medical History:  Diagnosis Date   Bipolar 1 disorder [facility])    Patient Active Problem List   Diagnosis Date Noted   Left leg swelling 03/15/2022   Bipolar affective disorder in remission (HCC) 01/31/2019   History of mononucleosis 07/05/2017   Past Surgical History:  Procedure Laterality Date   KNEE ARTHROSCOPY W/ MENISCAL REPAIR     OB History     Gravida  0   Para  0   Term  0   Preterm  0   AB  0   Living  0      SAB  0   IAB  0   Ectopic  0   Multiple  0   Live Births  0          Home Medications    Prior to Admission medications   Medication Sig Start Date End Date Taking? Authorizing Provider  lamoTRIgine  (LAMICTAL ) 150 MG tablet Take 1 tablet (150 mg total) by mouth daily. 04/30/24   Geralene Kaiser, MD    Family History Family History  Problem Relation Age of Onset   Hypertension Mother    Social History Social History   Tobacco Use   Smoking status: Never   Smokeless tobacco: Never  Vaping Use   Vaping status: Never Used  Substance Use Topics   Alcohol use: Not Currently    Alcohol/week: 0.0 - 1.0 standard drinks of alcohol   Drug use: Never    Allergies   Patient has no known allergies.  Review of Systems Review of Systems  Genitourinary:  Positive for dysuria.   Pertinent findings revealed after performing a 14 point review of systems has been noted in the history of present illness.  Physical Exam Vital Signs BP 127/84 (BP Location: Right Arm)   Pulse 74   Temp 98.3 F (36.8 C) (Oral)   Resp 16   LMP 06/21/2024   SpO2 99%   No data found.  Physical Exam Vitals and nursing note reviewed.  Constitutional:      General: She is awake. She is not in acute distress.    Appearance: Normal appearance. She is well-developed and well-groomed.  Abdominal:     General: Abdomen is flat. Bowel sounds are normal.     Palpations: Abdomen is soft.     Tenderness: There is no abdominal tenderness.  Neurological:     Mental Status: She is alert.  Psychiatric:        Attention and Perception: Attention and perception normal.        Mood and Affect: Mood and affect normal.        Speech: Speech normal.  Behavior: Behavior normal. Behavior is cooperative.     Visual Acuity Right Eye Distance:   Left Eye Distance:   Bilateral Distance:    Right Eye Near:   Left Eye Near:    Bilateral Near:     UC Couse / Diagnostics / Procedures:     Radiology No results found.  Procedures Procedures (including critical care time) EKG  Pending results:  Labs Reviewed  POCT URINE DIPSTICK - Abnormal; Notable for the following components:      Result Value   Spec Grav, UA <=1.005 (*)    All other components within normal limits  URINE CULTURE    Medications Ordered in UC: Medications - No data to display  UC Diagnoses / Final Clinical Impressions(s)   I have reviewed the triage vital signs and the nursing notes.  Pertinent labs & imaging results that were available during my care of the patient were reviewed by me and considered in my medical decision making (see chart for details).    Final diagnoses:   Dysuria   Urine dip today revealed no concern for urinary tract infection.  Urine culture will be performed due to patient's report of having active symptoms of a lower urinary tract infection. Patient advised that they will be contacted with results of the urine culture and that treatment will be provided as indicated based on results. Return precautions advised.  Please see discharge instructions below for details of plan of care as provided to patient. ED Prescriptions   None    PDMP not reviewed this encounter.  Disposition Upon Discharge:  Condition: stable for discharge home  Patient presented with concern for an acute illness with associated systemic symptoms and significant discomfort requiring urgent management. In my opinion, this is a condition that a prudent lay person (someone who possesses an average knowledge of health and medicine) may potentially expect to result in complications if not addressed urgently such as respiratory distress, impairment of bodily function or dysfunction of bodily organs.   As such, the patient has been evaluated and assessed, work-up was performed and treatment was provided in alignment with urgent care protocols and evidence based medicine.  Patient/parent/caregiver has been advised that the patient may require follow up for further testing and/or treatment if the symptoms continue in spite of treatment, as clinically indicated and appropriate.  Routine symptom specific, illness specific and/or disease specific instructions were discussed with the patient and/or caregiver at length.  Prevention strategies for avoiding STD exposure were also discussed.  The patient will follow up with their current PCP if and as advised. If the patient does not currently have a PCP we will assist them in obtaining one.   The patient may need specialty follow up if the symptoms continue, in spite of conservative treatment and management, for further workup,  evaluation, consultation and treatment as clinically indicated and appropriate.  Patient/parent/caregiver verbalized understanding and agreement of plan as discussed.  All questions were addressed during visit.  Please see discharge instructions below for further details of plan.    Discharge Instructions      The urinalysis that we performed in the clinic today was normal.  Urine culture will be performed because the symptoms you describe are concerning for a urinary tract infection.  The result of the urine culture will be available in the next 3 to 5 days and will be posted to your MyChart account.  If there is an abnormal finding, you will be contacted by phone and advised  of further treatment recommendations, if any.   Thank you for visiting  [facility] today.  We appreciate the opportunity to participate in your care.       This office note has been dictated using Teaching laboratory technician.  Unfortunately, this method of dictation can sometimes lead to typographical or grammatical errors.  I apologize for your inconvenience in advance if this occurs.  Please do not hesitate to reach out to me if clarification is needed.       Joesph Shaver Scales, [place] 07/17/24 1925

## 2024-07-17 NOTE — ED Triage Notes (Signed)
 C/O dysuria, frequency and lower back pain. Onset today. Patient states  I think I have a UTI Patient taking tylenol for symptoms. C/O fatigue and chills.

## 2024-07-17 NOTE — Discharge Instructions (Signed)
 The urinalysis that we performed in the clinic today was normal.  Urine culture will be performed because the symptoms you describe are concerning for a urinary tract infection.  The result of the urine culture will be available in the next 3 to 5 days and will be posted to your MyChart account.  If there is an abnormal finding, you will be contacted by phone and advised of further treatment recommendations, if any.   Thank you for visiting [facility] today.  We appreciate the opportunity to participate in your care.

## 2024-07-18 LAB — URINE CULTURE: Culture: NO GROWTH

## 2024-07-20 ENCOUNTER — Ambulatory Visit (HOSPITAL_COMMUNITY): Payer: Self-pay | Admitting: Emergency Medicine

## 2024-09-22 ENCOUNTER — Telehealth (HOSPITAL_COMMUNITY): Payer: Self-pay | Admitting: *Deleted

## 2024-09-22 MED ORDER — LAMOTRIGINE 150 MG PO TABS: 150.0000 mg | ORAL_TABLET | Freq: Every day | ORAL | 0 refills | Status: DC

## 2024-09-22 NOTE — Telephone Encounter (Signed)
 Patient pharmacy [facility] called stating they are needing refills for patient Lisa [name] . Pt f/u appt is 11-05-2024.

## 2024-09-26 ENCOUNTER — Ambulatory Visit
Admission: EM | Admit: 2024-09-26 | Discharge: 2024-09-26 | Disposition: A | Discharge status: Home / Self Care | Attending: Family Medicine | Admitting: Family Medicine

## 2024-09-26 ENCOUNTER — Other Ambulatory Visit: Payer: Self-pay

## 2024-09-26 DIAGNOSIS — L729 Follicular cyst of the skin and subcutaneous tissue, unspecified: Secondary | ICD-10-CM | POA: Diagnosis not present

## 2024-09-26 DIAGNOSIS — L089 Local infection of the skin and subcutaneous tissue, unspecified: Secondary | ICD-10-CM | POA: Diagnosis not present

## 2024-09-26 MED ORDER — DOXYCYCLINE HYCLATE 100 MG PO CAPS: 100.0000 mg | ORAL_CAPSULE | Freq: Two times a day (BID) | ORAL | 0 refills | Status: AC

## 2024-09-26 NOTE — Discharge Instructions (Addendum)
 Start doxycycline twice daily for 7 days.  Can follow-up with your PCP if your symptoms or not improving.  Please go to the emergency room if you develop any worsening symptoms.  Hope you feel better soon!

## 2024-09-26 NOTE — ED Triage Notes (Signed)
 Pt c/o abscess on right upper chestx42mos. Pt states last night it broke up and became swollen. Pt has an abscess on right upper chest approx the size of a nickel. It's erythematous and seems to be intact.

## 2024-09-26 NOTE — ED Provider Notes (Signed)
 [facility]    CSN: [id number] Arrival date & time: 09/26/24  1127      History   Chief Complaint Chief Complaint  Patient presents with   Abscess    HPI Lisa [name] is a 33 y.o. female presents for an infected cyst on chest.  Patient reports she has had a bump on her right upper chest for about 5 months.  States yesterday and began to become red, warm and tender.  No drainage fevers or chills.  No history of MRSA.  She has not take any OTC treatments for symptoms.  No other concerns at this time.   Abscess   Past Medical History:  Diagnosis Date   Bipolar 1 disorder [facility])     Patient Active Problem List   Diagnosis Date Noted   Left leg swelling 03/15/2022   Bipolar affective disorder in remission 01/31/2019   History of mononucleosis 07/05/2017    Past Surgical History:  Procedure Laterality Date   KNEE ARTHROSCOPY W/ MENISCAL REPAIR      OB History     Gravida  0   Para  0   Term  0   Preterm  0   AB  0   Living  0      SAB  0   IAB  0   Ectopic  0   Multiple  0   Live Births  0            Home Medications    Prior to Admission medications   Medication Sig Start Date End Date Taking? Authorizing Provider  doxycycline (VIBRAMYCIN) 100 MG capsule Take 1 capsule (100 mg total) by mouth 2 (two) times daily for 7 days. 09/26/24 10/03/24 Yes Darius Lundberg, Jodi R, NP  lamoTRIgine  (LAMICTAL ) 150 MG tablet Take 1 tablet (150 mg total) by mouth daily. 09/22/24   Geralene Kaiser, MD    Family History Family History  Problem Relation Age of Onset   Hypertension Mother     Social History Social History   Tobacco Use   Smoking status: Never   Smokeless tobacco: Never  Vaping Use   Vaping status: Never Used  Substance Use Topics   Alcohol use: Not Currently    Alcohol/week: 0.0 - 1.0 standard drinks of alcohol   Drug use: Never     Allergies   Patient has no known allergies.   Review of Systems Review of Systems  Skin:         Infected cyst of chest wall     Physical Exam Triage Vital Signs ED Triage Vitals  Encounter Vitals Group     BP 09/26/24 1155 113/80     Girls Systolic BP Percentile --      Girls Diastolic BP Percentile --      Boys Systolic BP Percentile --      Boys Diastolic BP Percentile --      Pulse Rate 09/26/24 1155 65     Resp 09/26/24 1155 16     Temp 09/26/24 1155 99 F (37.2 C)     Temp Source 09/26/24 1155 Oral     SpO2 09/26/24 1155 95 %     Weight --      Height --      Head Circumference --      Peak Flow --      Pain Score 09/26/24 1153 7     Pain Loc --      Pain Education --  Exclude from Growth Chart --    No data found.  Updated Vital Signs BP 113/80   Pulse 65   Temp 99 F (37.2 C) (Oral)   Resp 16   LMP 09/14/2024   SpO2 95%   Visual Acuity Right Eye Distance:   Left Eye Distance:   Bilateral Distance:    Right Eye Near:   Left Eye Near:    Bilateral Near:     Physical Exam Vitals and nursing note reviewed.  Constitutional:      General: She is not in acute distress.    Appearance: Normal appearance. She is not ill-appearing.  HENT:     Head: Normocephalic and atraumatic.  Eyes:     Pupils: Pupils are equal, round, and reactive to light.  Cardiovascular:     Rate and Rhythm: Normal rate.  Pulmonary:     Effort: Pulmonary effort is normal.  Chest:       Comments: There is a 1.5 cm cyst to the right upper chest wall with mild erythema surrounding with tenderness with palpation.  No induration or fluctuance or drainage. Skin:    General: Skin is warm and dry.  Neurological:     General: No focal deficit present.     Mental Status: She is alert and oriented to person, place, and time.  Psychiatric:        Mood and Affect: Mood normal.        Behavior: Behavior normal.      UC Treatments / Results  Labs (all labs ordered are listed, but only abnormal results are displayed) Labs Reviewed - No data to  display  EKG   Radiology No results found.  Procedures Procedures (including critical care time)  Medications Ordered in UC Medications - No data to display  Initial Impression / Assessment and Plan / UC Course  I have reviewed the triage vital signs and the nursing notes.  Pertinent labs & imaging results that were available during my care of the patient were reviewed by me and considered in my medical decision making (see chart for details).     Reviewed exam and symptoms with patient.  No red flags.  Discussed infected cyst, start doxycycline.  Advised PCP follow-up if symptoms do not improve.  ER precautions reviewed. Final Clinical Impressions(s) / UC Diagnoses   Final diagnoses:  Infected cyst of skin     Discharge Instructions      Start doxycycline twice daily for 7 days.  Can follow-up with your PCP if your symptoms or not improving.  Please go to the emergency room if you develop any worsening symptoms.  Hope you feel better soon!    ED Prescriptions     Medication Sig Dispense Auth. Provider   doxycycline (VIBRAMYCIN) 100 MG capsule Take 1 capsule (100 mg total) by mouth 2 (two) times daily for 7 days. 14 capsule Robynn Marcel, Jodi R, NP      PDMP not reviewed this encounter.   Loreda Myla SAUNDERS, NP 09/26/24 [phone]

## 2024-11-05 ENCOUNTER — Telehealth (INDEPENDENT_AMBULATORY_CARE_PROVIDER_SITE_OTHER): Admitting: Psychiatry

## 2024-11-05 ENCOUNTER — Encounter (HOSPITAL_COMMUNITY): Payer: Self-pay | Admitting: Psychiatry

## 2024-11-05 DIAGNOSIS — F5102 Adjustment insomnia: Secondary | ICD-10-CM

## 2024-11-05 DIAGNOSIS — F3170 Bipolar disorder, currently in remission, most recent episode unspecified: Secondary | ICD-10-CM

## 2024-11-05 MED ORDER — LAMOTRIGINE 150 MG PO TABS: 150.0000 mg | ORAL_TABLET | Freq: Every day | ORAL | 0 refills | Status: AC

## 2024-11-05 NOTE — Progress Notes (Signed)
 [facility] Follow up visit  Patient Identification: Lisa [name] MRN:  [id number] Date of Evaluation:  11/05/2024 Referral Source: Dr. Velma Chief Complaint:  follow up bipolar,  Visit Diagnosis:    ICD-10-CM   1. Bipolar affective disorder in remission  F31.70     2. Adjustment insomnia  F51.02     Virtual Visit via Video Note  I connected with Lisa [name] on 11/05/24 at  9:00 AM EST by a video enabled telemedicine application and verified that I am speaking with the correct person using two identifiers.  Location: Patient: home  Provider: home office   I discussed the limitations of evaluation and management by telemedicine and the availability of in person appointments. The patient expressed understanding and agreed to proceed.     I discussed the assessment and treatment plan with the patient. The patient was provided an opportunity to ask questions and all were answered. The patient agreed with the plan and demonstrated an understanding of the instructions.   The patient was advised to call back or seek an in-person evaluation if the symptoms worsen or if the condition fails to improve as anticipated.  I provided 16 minutes of non-face-to-face time during this encounter.    History of Present Illness: Patient is a 33 years old currently single [place] descent female lives with her parents relocated from [place] .  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 Velma to establish care for possible bipolar  On evaluation doing reasonable although she has been related off from work because of no need for contractors so she is trying to look for other job but handling finances okay for now  She does have a good support family lives nearby  Self-esteem and depression and mood is stable on Lamictal  some fogginess during the day but she has discontinued her alcohol intake and is trying to manage her sleep hours for now  Aggravating factor: relationship  breakups in the past,  self esteem in past Modifying factor: parents co workers, yoga  Duration since young age  Severity i manageable    Past Psychiatric History: depression, [id number]  Previous Psychotropic Medications: Yes   Substance Abuse History in the last 12 months:  No.  Consequences of Substance Abuse: NA  Past Medical History:  Past Medical History:  Diagnosis Date   Bipolar 1 disorder (HCC)     Past Surgical History:  Procedure Laterality Date   KNEE ARTHROSCOPY W/ MENISCAL REPAIR      Family Psychiatric History: mom: depression  Family History:  Family History  Problem Relation Age of Onset   Hypertension Mother     Social History:   Social History   Socioeconomic History   Marital status: Single    Spouse name: Not on file   Number of children: Not on file   Years of education: Not on file   Highest education level: Not on file  Occupational History   Occupation: Risk Analyst  Tobacco Use   Smoking status: Never   Smokeless tobacco: Never  Vaping Use   Vaping status: Never Used  Substance and Sexual Activity   Alcohol use: Not Currently    Alcohol/week: 0.0 - 1.0 standard drinks of alcohol   Drug use: Never   Sexual activity: Not Currently    Birth control/protection: Abstinence  Other Topics Concern   Not on file  Social History Narrative   Not on file   Social Drivers of Health   Financial Resource Strain: Low  Risk (12/11/2022)   Received from [facility]   Overall Financial Resource Strain (CARDIA)    Difficulty of Paying Living Expenses: Not very hard  Food Insecurity: No Food Insecurity (12/11/2022)   Received from [facility]   Hunger Vital Sign    Within the past 12 months, you worried that your food would run out before you got the money to buy more.: Never true    Within the past 12 months, the food you bought just didn't last and you didn't have money to get more.: Never true  Transportation Needs: No  Transportation Needs (12/11/2022)   Received from [facility] - Transportation    Lack of Transportation (Medical): No    Lack of Transportation (Non-Medical): No  Physical Activity: Sufficiently Active (12/08/2022)   Received from [facility]   Exercise Vital Sign    On average, how many days per week do you engage in moderate to strenuous exercise (like a brisk walk)?: 3 days    On average, how many minutes do you engage in exercise at this level?: 50 min  Stress: Stress Concern Present (12/08/2022)   Received from [facility] Occupational [facility] - Occupational Stress Questionnaire    Feeling of Stress : To some extent  Social Connections: Moderately Integrated (12/08/2022)   Received from [facility]   Social Network    How would you rate your social network (family, work, friends)?: Adequate participation with social networks   w up with parents, migrated from [place] age 70. Difficult in school adjustment and fear of abondomement   Allergies:  No Known Allergies  Metabolic Disorder Labs: No results found for: HGBA1C, MPG No results found for: PROLACTIN No results found for: CHOL, TRIG, HDL, CHOLHDL, VLDL, LDLCALC No results found for: TSH  Therapeutic Level Labs: No results found for: LITHIUM No results found for: CBMZ No results found for: VALPROATE  Current Medications: Current Outpatient Medications  Medication Sig Dispense Refill   lamoTRIgine  (LAMICTAL ) 150 MG tablet Take 1 tablet (150 mg total) by mouth daily. 90 tablet 0   No current facility-administered medications for this visit.     Psychiatric Specialty Exam: Review of Systems  Cardiovascular:  Negative for chest pain.  Neurological:  Negative for tremors.  Psychiatric/Behavioral:  Negative for agitation and self-injury.     There were no vitals taken for this visit.There is no height or weight on file to calculate BMI.  General  Appearance: Casual  Eye Contact:  Fair  Speech:  Clear and Coherent  Volume:  Normal  Mood: Improved   Affect:  Congruent  Thought Process:  Goal Directed  Orientation:  Full (Time, Place, and Person)  Thought Content:  Logical  Suicidal Thoughts:  No  Homicidal Thoughts:  No  Memory:  Immediate;   Fair Recent;   Good  Judgement:  Good  Insight:  Fair  Psychomotor Activity:  Normal  Concentration:  Concentration: Fair and Attention Span: Fair  Recall:  [employer] of Knowledge:Good  Language: Good  Akathisia:  No  Handed:    AIMS (if indicated):  not done  Assets:  Communication Skills Desire for Improvement Financial Resources/Insurance Housing  ADL's:  Intact  Cognition: WNL  Sleep:  Fair   Screenings: GAD-7    Flowsheet Row Office Visit from 02/22/2021 in [facility] at [facility]  Total GAD-7 Score 4   PHQ2-9    Flowsheet Row Office Visit  from 09/29/2021 in [facility] Outpatient Behavioral Health at [facility] Counselor from 06/08/2021 in [facility] Behavior[facility] at [facility] Office Visit from 04[facility]28/2021 in [facility] Behavioral Health at [facility] Office Visit from 02/22/2021 in [facility] & [facility] Medicine at [facility] Total Score 0 1 6 2   PHQ-9 Total Score -- -- 12 3   Flowsheet Row UC from 09/26/2024 in [facility] UC from 07/17/2024 in [facility]) Video Visit from 10/16/2022 in [facility] Out[facility] Behavioral Health at [facility]  C-SSRS RISK CATEGORY No Risk No Risk No Risk    Assessment and Plan: as follows Prior documentation reviewed  Bipolar per history:  in remission.  Mood remains stable continue Lamictal  provided supportive therapy no rash reported   Insomna: Handling it fair has quit alcohol describes her  sleep to be getting better   Anixety : Manageable without citalopram  continue distraction techniques and keeping herself active  Refill of Lamictal  sent Fu 52m.  Jackey Flight, MD 12/10/20259:11 AM

## 2025-05-06 ENCOUNTER — Telehealth (HOSPITAL_COMMUNITY): Admitting: Psychiatry
